# Patient Record
Sex: Female | Born: 1945 | Race: White | Hispanic: No | Marital: Married | Smoking: Never smoker
Health system: Southern US, Community
[De-identification: ages and names within clinical notes are randomized; demographics above are authoritative.]

## PROBLEM LIST (undated history)

## (undated) DIAGNOSIS — K219 Gastro-esophageal reflux disease without esophagitis: Secondary | ICD-10-CM

## (undated) DIAGNOSIS — R51 Headache: Secondary | ICD-10-CM

## (undated) DIAGNOSIS — IMO0002 Reserved for concepts with insufficient information to code with codable children: Secondary | ICD-10-CM

## (undated) DIAGNOSIS — K759 Inflammatory liver disease, unspecified: Secondary | ICD-10-CM

## (undated) DIAGNOSIS — Z8719 Personal history of other diseases of the digestive system: Secondary | ICD-10-CM

## (undated) DIAGNOSIS — M1990 Unspecified osteoarthritis, unspecified site: Secondary | ICD-10-CM

## (undated) DIAGNOSIS — R002 Palpitations: Secondary | ICD-10-CM

## (undated) HISTORY — PX: BREAST SURGERY: SHX581

## (undated) HISTORY — PX: TUBAL LIGATION: SHX77

## (undated) HISTORY — PX: APPENDECTOMY: SHX54

---

## 1991-12-14 HISTORY — PX: BACK SURGERY: SHX140

## 2013-04-26 ENCOUNTER — Other Ambulatory Visit (HOSPITAL_COMMUNITY): Payer: Self-pay | Admitting: Orthopaedic Surgery

## 2013-05-02 ENCOUNTER — Encounter (HOSPITAL_COMMUNITY): Payer: Self-pay | Admitting: Pharmacy Technician

## 2013-05-10 ENCOUNTER — Encounter (HOSPITAL_COMMUNITY)
Admission: RE | Admit: 2013-05-10 | Discharge: 2013-05-10 | Disposition: A | Discharge status: Home / Self Care | Payer: Medicare Other | Source: Ambulatory Visit | Attending: Orthopaedic Surgery | Admitting: Orthopaedic Surgery

## 2013-05-10 ENCOUNTER — Encounter (HOSPITAL_COMMUNITY): Payer: Self-pay

## 2013-05-10 DIAGNOSIS — M169 Osteoarthritis of hip, unspecified: Secondary | ICD-10-CM | POA: Insufficient documentation

## 2013-05-10 DIAGNOSIS — Z0183 Encounter for blood typing: Secondary | ICD-10-CM | POA: Insufficient documentation

## 2013-05-10 DIAGNOSIS — Z01812 Encounter for preprocedural laboratory examination: Secondary | ICD-10-CM | POA: Insufficient documentation

## 2013-05-10 DIAGNOSIS — M1610 Unilateral primary osteoarthritis, unspecified hip: Secondary | ICD-10-CM | POA: Insufficient documentation

## 2013-05-10 DIAGNOSIS — Z01810 Encounter for preprocedural cardiovascular examination: Secondary | ICD-10-CM | POA: Insufficient documentation

## 2013-05-10 HISTORY — DX: Palpitations: R00.2

## 2013-05-10 HISTORY — DX: Gastro-esophageal reflux disease without esophagitis: K21.9

## 2013-05-10 HISTORY — DX: Unspecified osteoarthritis, unspecified site: M19.90

## 2013-05-10 HISTORY — DX: Personal history of other diseases of the digestive system: Z87.19

## 2013-05-10 HISTORY — DX: Headache: R51

## 2013-05-10 HISTORY — DX: Inflammatory liver disease, unspecified: K75.9

## 2013-05-10 LAB — URINALYSIS, ROUTINE W REFLEX MICROSCOPIC
Bilirubin Urine: NEGATIVE
Nitrite: NEGATIVE
Specific Gravity, Urine: 1.028 (ref 1.005–1.030)
Urobilinogen, UA: 0.2 mg/dL (ref 0.0–1.0)
pH: 6 (ref 5.0–8.0)

## 2013-05-10 LAB — APTT: aPTT: 39 seconds — ABNORMAL HIGH (ref 24–37)

## 2013-05-10 LAB — BASIC METABOLIC PANEL
Calcium: 9.6 mg/dL (ref 8.4–10.5)
Creatinine, Ser: 0.78 mg/dL (ref 0.50–1.10)
GFR calc Af Amer: >90 mL/min (ref >90)
GFR calc non Af Amer: 85 mL/min — ABNORMAL LOW (ref >90)
Sodium: 139 mEq/L (ref 135–145)

## 2013-05-10 LAB — CBC
MCV: 91.2 fL (ref 78.0–100.0)
Platelets: 220 10*3/uL (ref 150–400)
RBC: 4.52 MIL/uL (ref 3.87–5.11)
RDW: 13 % (ref 11.5–15.5)
WBC: 4.2 10*3/uL (ref 4.0–10.5)

## 2013-05-10 LAB — SURGICAL PCR SCREEN: MRSA, PCR: NEGATIVE

## 2013-05-10 LAB — PROTIME-INR: INR: 0.99 (ref 0.00–1.49)

## 2013-05-10 LAB — URINE MICROSCOPIC-ADD ON

## 2013-05-10 NOTE — Patient Instructions (Signed)
YOUR SURGERY IS SCHEDULED AT [facility]  ON:  Friday  June 6th  REPORT TO [facility] AT:  5:30 AM      PHONE # FOR SHORT STAY IS [phone]  DO NOT EAT OR DRINK ANYTHING AFTER MIDNIGHT THE NIGHT BEFORE YOUR SURGERY.  YOU MAY BRUSH YOUR TEETH, RINSE OUT YOUR MOUTH--BUT NO WATER, NO FOOD, NO CHEWING GUM, NO MINTS, NO CANDIES, NO CHEWING TOBACCO.  PLEASE TAKE THE FOLLOWING MEDICATIONS THE AM OF YOUR SURGERY WITH A FEW SIPS OF WATER: PANTOPRAZOL    DO NOT BRING VALUABLES, MONEY, CREDIT CARDS.  DO NOT WEAR JEWELRY, MAKE-UP, NAIL POLISH AND NO METAL PINS OR CLIPS IN YOUR HAIR. CONTACT LENS, DENTURES / PARTIALS, GLASSES SHOULD NOT BE WORN TO SURGERY AND IN MOST CASES-HEARING AIDS WILL NEED TO BE REMOVED.  BRING YOUR GLASSES CASE, ANY EQUIPMENT NEEDED FOR YOUR CONTACT LENS. FOR PATIENTS ADMITTED TO THE HOSPITAL--CHECK OUT TIME THE DAY OF DISCHARGE IS 11:00 AM.  ALL INPATIENT ROOMS ARE PRIVATE - WITH BATHROOM, TELEPHONE, TELEVISION AND WIFI INTERNET.                             PLEASE READ OVER ANY  FACT SHEETS THAT YOU WERE GIVEN: MRSA INFORMATION, BLOOD TRANSFUSION INFORMATION, INCENTIVE SPIROMETER INFORMATION. FAILURE TO FOLLOW THESE INSTRUCTIONS MAY RESULT IN THE CANCELLATION OF YOUR SURGERY.   PATIENT SIGNATURE_________________________________

## 2013-05-10 NOTE — Pre-Procedure Instructions (Signed)
HX OF PALPITATIONS - PREOP EKG WAS DONE TODAY AT [facility] - EKG NORMAL - CXR NOT NEEDED PER ANESTHESIOLOGIST'S GUIDELINES.

## 2013-05-18 ENCOUNTER — Inpatient Hospital Stay (HOSPITAL_COMMUNITY): Payer: Medicare Other

## 2013-05-18 ENCOUNTER — Inpatient Hospital Stay (HOSPITAL_COMMUNITY)
Admission: RE | Admit: 2013-05-18 | Discharge: 2013-05-20 | DRG: 470 | Disposition: A | Discharge status: Home Health | Payer: Medicare Other | Source: Ambulatory Visit | Attending: Orthopaedic Surgery | Admitting: Orthopaedic Surgery

## 2013-05-18 ENCOUNTER — Encounter (HOSPITAL_COMMUNITY)
Admission: RE | Disposition: A | Discharge status: Home Health | Payer: Self-pay | Source: Ambulatory Visit | Attending: Orthopaedic Surgery

## 2013-05-18 ENCOUNTER — Inpatient Hospital Stay (HOSPITAL_COMMUNITY): Payer: Medicare Other | Admitting: Certified Registered"

## 2013-05-18 ENCOUNTER — Encounter (HOSPITAL_COMMUNITY): Payer: Self-pay | Admitting: Certified Registered"

## 2013-05-18 ENCOUNTER — Encounter (HOSPITAL_COMMUNITY): Payer: Self-pay | Admitting: *Deleted

## 2013-05-18 DIAGNOSIS — Z8619 Personal history of other infectious and parasitic diseases: Secondary | ICD-10-CM

## 2013-05-18 DIAGNOSIS — M169 Osteoarthritis of hip, unspecified: Principal | ICD-10-CM | POA: Diagnosis present

## 2013-05-18 DIAGNOSIS — Z79899 Other long term (current) drug therapy: Secondary | ICD-10-CM

## 2013-05-18 DIAGNOSIS — D62 Acute posthemorrhagic anemia: Secondary | ICD-10-CM | POA: Diagnosis not present

## 2013-05-18 DIAGNOSIS — M1610 Unilateral primary osteoarthritis, unspecified hip: Principal | ICD-10-CM | POA: Diagnosis present

## 2013-05-18 DIAGNOSIS — K219 Gastro-esophageal reflux disease without esophagitis: Secondary | ICD-10-CM | POA: Diagnosis present

## 2013-05-18 HISTORY — PX: TOTAL HIP ARTHROPLASTY: SHX124

## 2013-05-18 LAB — CBC
MCH: 30.5 pg (ref 26.0–34.0)
MCHC: 33.4 g/dL (ref 30.0–36.0)
Platelets: 175 10*3/uL (ref 150–400)

## 2013-05-18 LAB — TYPE AND SCREEN: Antibody Screen: NEGATIVE

## 2013-05-18 SURGERY — ARTHROPLASTY, HIP, TOTAL, ANTERIOR APPROACH
Anesthesia: Spinal | Site: Hip | Laterality: Left | Wound class: Clean

## 2013-05-18 MED ORDER — PANTOPRAZOLE SODIUM 40 MG PO TBEC
40.0000 mg | DELAYED_RELEASE_TABLET | Freq: Every day | ORAL | Status: DC
  Administered 2013-05-19 – 2013-05-20 (×2): 40 mg via ORAL
  Filled 2013-05-18 (×3): qty 1

## 2013-05-18 MED ORDER — DIPHENHYDRAMINE HCL 12.5 MG/5ML PO ELIX: 12.5000 mg | ORAL_SOLUTION | ORAL | Status: DC | PRN

## 2013-05-18 MED ORDER — PROPOFOL INFUSION 10 MG/ML OPTIME
INTRAVENOUS | Status: DC | PRN
  Administered 2013-05-18: 100 ug/kg/min via INTRAVENOUS

## 2013-05-18 MED ORDER — OXYCODONE HCL ER 10 MG PO T12A
10.0000 mg | EXTENDED_RELEASE_TABLET | Freq: Two times a day (BID) | ORAL | Status: DC
  Administered 2013-05-19: 10 mg via ORAL
  Filled 2013-05-18 (×2): qty 1

## 2013-05-18 MED ORDER — DEXAMETHASONE SODIUM PHOSPHATE 10 MG/ML IJ SOLN
INTRAMUSCULAR | Status: DC | PRN
  Administered 2013-05-18: 10 mg via INTRAVENOUS

## 2013-05-18 MED ORDER — SODIUM CHLORIDE 0.9 % IV SOLN
INTRAVENOUS | Status: DC
  Administered 2013-05-18: 1000 mL via INTRAVENOUS
  Administered 2013-05-19: 02:00:00 via INTRAVENOUS

## 2013-05-18 MED ORDER — ACETAMINOPHEN 650 MG RE SUPP: 650.0000 mg | Freq: Four times a day (QID) | RECTAL | Status: DC | PRN

## 2013-05-18 MED ORDER — ONDANSETRON HCL 4 MG/2ML IJ SOLN: 4.0000 mg | Freq: Four times a day (QID) | INTRAMUSCULAR | Status: DC | PRN

## 2013-05-18 MED ORDER — DOCUSATE SODIUM 100 MG PO CAPS
100.0000 mg | ORAL_CAPSULE | Freq: Two times a day (BID) | ORAL | Status: DC
  Administered 2013-05-18 – 2013-05-20 (×4): 100 mg via ORAL
  Filled 2013-05-18 (×3): qty 1

## 2013-05-18 MED ORDER — 0.9 % SODIUM CHLORIDE (POUR BTL) OPTIME
TOPICAL | Status: DC | PRN
  Administered 2013-05-18: 1000 mL

## 2013-05-18 MED ORDER — CLINDAMYCIN PHOSPHATE 600 MG/50ML IV SOLN
600.0000 mg | Freq: Four times a day (QID) | INTRAVENOUS | Status: AC
  Administered 2013-05-18 (×2): 600 mg via INTRAVENOUS
  Filled 2013-05-18 (×2): qty 50

## 2013-05-18 MED ORDER — BISACODYL 10 MG RE SUPP: 10.0000 mg | Freq: Every day | RECTAL | Status: DC | PRN

## 2013-05-18 MED ORDER — SODIUM CHLORIDE 0.9 % IV BOLUS (SEPSIS)
500.0000 mL | Freq: Once | INTRAVENOUS | Status: AC
  Administered 2013-05-18: 500 mL via INTRAVENOUS

## 2013-05-18 MED ORDER — HYDROMORPHONE HCL PF 1 MG/ML IJ SOLN
1.0000 mg | INTRAMUSCULAR | Status: DC | PRN
  Administered 2013-05-18: 0.5 mg via INTRAVENOUS
  Filled 2013-05-18: qty 1

## 2013-05-18 MED ORDER — PROMETHAZINE HCL 25 MG/ML IJ SOLN: 6.2500 mg | INTRAMUSCULAR | Status: DC | PRN

## 2013-05-18 MED ORDER — NALOXONE HCL 0.4 MG/ML IJ SOLN
INTRAMUSCULAR | Status: AC
  Administered 2013-05-18: 0.4 mg
  Filled 2013-05-18: qty 1

## 2013-05-18 MED ORDER — FENTANYL CITRATE 0.05 MG/ML IJ SOLN
INTRAMUSCULAR | Status: DC | PRN
  Administered 2013-05-18: 50 ug via INTRAVENOUS

## 2013-05-18 MED ORDER — MIDAZOLAM HCL 5 MG/5ML IJ SOLN
INTRAMUSCULAR | Status: DC | PRN
  Administered 2013-05-18: 2 mg via INTRAVENOUS

## 2013-05-18 MED ORDER — NALOXONE HCL 0.4 MG/ML IJ SOLN
0.4000 mg | INTRAMUSCULAR | Status: DC | PRN
  Administered 2013-05-18: 0.4 mg via INTRAVENOUS

## 2013-05-18 MED ORDER — FENTANYL CITRATE 0.05 MG/ML IJ SOLN: 25.0000 ug | INTRAMUSCULAR | Status: DC | PRN

## 2013-05-18 MED ORDER — SODIUM CHLORIDE 0.9 % IR SOLN
Status: DC | PRN
  Administered 2013-05-18: 1000 mL

## 2013-05-18 MED ORDER — LACTATED RINGERS IV SOLN
INTRAVENOUS | Status: DC | PRN
  Administered 2013-05-18 (×2): via INTRAVENOUS

## 2013-05-18 MED ORDER — ONDANSETRON HCL 4 MG/2ML IJ SOLN
INTRAMUSCULAR | Status: DC | PRN
  Administered 2013-05-18: 4 mg via INTRAVENOUS

## 2013-05-18 MED ORDER — BUPIVACAINE HCL (PF) 0.5 % IJ SOLN
INTRAMUSCULAR | Status: DC | PRN
  Administered 2013-05-18: 15 mg

## 2013-05-18 MED ORDER — ASPIRIN EC 325 MG PO TBEC
325.0000 mg | DELAYED_RELEASE_TABLET | Freq: Two times a day (BID) | ORAL | Status: DC
  Administered 2013-05-18 – 2013-05-20 (×3): 325 mg via ORAL
  Filled 2013-05-18 (×6): qty 1

## 2013-05-18 MED ORDER — ALUM & MAG HYDROXIDE-SIMETH 200-200-20 MG/5ML PO SUSP: 30.0000 mL | ORAL | Status: DC | PRN

## 2013-05-18 MED ORDER — CEFAZOLIN SODIUM-DEXTROSE 2-3 GM-% IV SOLR
2.0000 g | INTRAVENOUS | Status: AC
  Administered 2013-05-18: 2 g via INTRAVENOUS

## 2013-05-18 MED ORDER — POLYETHYLENE GLYCOL 3350 17 G PO PACK
17.0000 g | PACK | Freq: Every day | ORAL | Status: DC | PRN
  Filled 2013-05-18 (×2): qty 1

## 2013-05-18 MED ORDER — PHENYLEPHRINE HCL 10 MG/ML IJ SOLN
INTRAMUSCULAR | Status: DC | PRN
  Administered 2013-05-18 (×3): 40 ug via INTRAVENOUS

## 2013-05-18 MED ORDER — ONDANSETRON HCL 4 MG PO TABS: 4.0000 mg | ORAL_TABLET | Freq: Four times a day (QID) | ORAL | Status: DC | PRN

## 2013-05-18 MED ORDER — PHENYLEPHRINE HCL 10 MG/ML IJ SOLN
10.0000 mg | INTRAVENOUS | Status: DC | PRN
  Administered 2013-05-18: 50 ug/min via INTRAVENOUS

## 2013-05-18 MED ORDER — METHOCARBAMOL 500 MG PO TABS: 500.0000 mg | ORAL_TABLET | Freq: Four times a day (QID) | ORAL | Status: DC | PRN

## 2013-05-18 MED ORDER — OXYCODONE HCL 5 MG PO TABS: 5.0000 mg | ORAL_TABLET | ORAL | Status: DC | PRN

## 2013-05-18 MED ORDER — ACETAMINOPHEN 325 MG PO TABS
650.0000 mg | ORAL_TABLET | Freq: Four times a day (QID) | ORAL | Status: DC | PRN
  Administered 2013-05-18 – 2013-05-20 (×5): 650 mg via ORAL
  Filled 2013-05-18 (×6): qty 2

## 2013-05-18 MED ORDER — METHOCARBAMOL 100 MG/ML IJ SOLN
500.0000 mg | Freq: Four times a day (QID) | INTRAVENOUS | Status: DC | PRN
  Administered 2013-05-18: 500 mg via INTRAVENOUS
  Filled 2013-05-18: qty 5

## 2013-05-18 MED ORDER — MENTHOL 3 MG MT LOZG: 1.0000 | LOZENGE | OROMUCOSAL | Status: DC | PRN

## 2013-05-18 MED ORDER — METOCLOPRAMIDE HCL 10 MG PO TABS: 5.0000 mg | ORAL_TABLET | Freq: Three times a day (TID) | ORAL | Status: DC | PRN

## 2013-05-18 MED ORDER — PHENOL 1.4 % MT LIQD: 1.0000 | OROMUCOSAL | Status: DC | PRN

## 2013-05-18 MED ORDER — METOCLOPRAMIDE HCL 5 MG/ML IJ SOLN: 5.0000 mg | Freq: Three times a day (TID) | INTRAMUSCULAR | Status: DC | PRN

## 2013-05-18 MED ORDER — FERROUS SULFATE 325 (65 FE) MG PO TABS
325.0000 mg | ORAL_TABLET | Freq: Three times a day (TID) | ORAL | Status: DC
  Administered 2013-05-18 – 2013-05-20 (×4): 325 mg via ORAL
  Filled 2013-05-18 (×9): qty 1

## 2013-05-18 MED ORDER — ZOLPIDEM TARTRATE 5 MG PO TABS: 5.0000 mg | ORAL_TABLET | Freq: Every evening | ORAL | Status: DC | PRN

## 2013-05-18 SURGICAL SUPPLY — 41 items
BAG ZIPLOCK 12X15 (MISCELLANEOUS) ×4 IMPLANT
BLADE SAW SGTL 18X1.27X75 (BLADE) ×2 IMPLANT
CAPT HIP PF COP ×2 IMPLANT
CELLS DAT CNTRL 66122 CELL SVR (MISCELLANEOUS) ×1 IMPLANT
CLOTH BEACON ORANGE TIMEOUT ST (SAFETY) ×2 IMPLANT
DERMABOND ADVANCED (GAUZE/BANDAGES/DRESSINGS) ×1
DERMABOND ADVANCED .7 DNX12 (GAUZE/BANDAGES/DRESSINGS) ×1 IMPLANT
DRAPE C-ARM 42X120 X-RAY (DRAPES) ×2 IMPLANT
DRAPE STERI IOBAN 125X83 (DRAPES) ×2 IMPLANT
DRAPE U-SHAPE 47X51 STRL (DRAPES) ×6 IMPLANT
DRSG AQUACEL AG ADV 3.5X10 (GAUZE/BANDAGES/DRESSINGS) ×2 IMPLANT
DRSG TEGADERM 4X4.75 (GAUZE/BANDAGES/DRESSINGS) ×2 IMPLANT
DURAPREP 26ML APPLICATOR (WOUND CARE) ×2 IMPLANT
ELECT BLADE TIP CTD 4 INCH (ELECTRODE) ×2 IMPLANT
ELECT REM PT RETURN 9FT ADLT (ELECTROSURGICAL) ×2
ELECTRODE REM PT RTRN 9FT ADLT (ELECTROSURGICAL) ×1 IMPLANT
EVACUATOR 1/8 PVC DRAIN (DRAIN) ×4 IMPLANT
FACESHIELD LNG OPTICON STERILE (SAFETY) ×8 IMPLANT
GAUZE SPONGE 2X2 8PLY STRL LF (GAUZE/BANDAGES/DRESSINGS) ×1 IMPLANT
GLOVE BIO SURGEON STRL SZ7.5 (GLOVE) ×2 IMPLANT
GLOVE BIOGEL PI IND STRL 8 (GLOVE) ×2 IMPLANT
GLOVE BIOGEL PI INDICATOR 8 (GLOVE) ×2
GLOVE ECLIPSE 8.0 STRL XLNG CF (GLOVE) ×2 IMPLANT
GOWN STRL REIN XL XLG (GOWN DISPOSABLE) ×4 IMPLANT
HANDPIECE INTERPULSE COAX TIP (DISPOSABLE) ×1
KIT BASIN OR (CUSTOM PROCEDURE TRAY) ×2 IMPLANT
PACK TOTAL JOINT (CUSTOM PROCEDURE TRAY) ×2 IMPLANT
PADDING CAST COTTON 6X4 STRL (CAST SUPPLIES) ×2 IMPLANT
RTRCTR WOUND ALEXIS 18CM MED (MISCELLANEOUS) ×2
SET HNDPC FAN SPRY TIP SCT (DISPOSABLE) ×1 IMPLANT
SPONGE GAUZE 2X2 STER 10/PKG (GAUZE/BANDAGES/DRESSINGS) ×1
SUT ETHIBOND NAB CT1 #1 30IN (SUTURE) ×4 IMPLANT
SUT ETHILON 3 0 PS 1 (SUTURE) ×2 IMPLANT
SUT MNCRL AB 4-0 PS2 18 (SUTURE) ×2 IMPLANT
SUT VIC AB 0 CT1 36 (SUTURE) ×2 IMPLANT
SUT VIC AB 1 CT1 36 (SUTURE) ×4 IMPLANT
SUT VIC AB 2-0 CT1 27 (SUTURE) ×1
SUT VIC AB 2-0 CT1 TAPERPNT 27 (SUTURE) ×1 IMPLANT
TOWEL OR 17X26 10 PK STRL BLUE (TOWEL DISPOSABLE) ×2 IMPLANT
TOWEL OR NON WOVEN STRL DISP B (DISPOSABLE) ×2 IMPLANT
TRAY FOLEY CATH 14FRSI W/METER (CATHETERS) ×2 IMPLANT

## 2013-05-18 NOTE — Anesthesia Postprocedure Evaluation (Signed)
  Anesthesia Post-op Note  Patient: Victoria Ward  Procedure(s) Performed: Procedure(s) (LRB): LEFT TOTAL HIP ARTHROPLASTY ANTERIOR APPROACH (Left)  Patient Location: PACU  Anesthesia Type: Spinal  Level of Consciousness: awake and alert   Airway and Oxygen Therapy: Patient Spontanous Breathing  Post-op Pain: mild  Post-op Assessment: Post-op Vital signs reviewed, Patient's Cardiovascular Status Stable, Respiratory Function Stable, Patent Airway and No signs of Nausea or vomiting  Last Vitals:  Filed Vitals:   05/18/13 0945  BP: 112/69  Pulse: 68  Temp:   Resp: 14    Post-op Vital Signs: stable   Complications: No apparent anesthesia complications

## 2013-05-18 NOTE — Anesthesia Procedure Notes (Signed)
Spinal  Patient location during procedure: OR Staffing Performed by: anesthesiologist  Preanesthetic Checklist Completed: patient identified, site marked, surgical consent, pre-op evaluation, timeout performed, IV checked, risks and benefits discussed and monitors and equipment checked Spinal Block Patient position: sitting Prep: Betadine Patient monitoring: heart rate, continuous pulse ox and blood pressure Injection technique: single-shot Needle Needle type: Quincke  Needle gauge: 24 G Needle length: 9 cm Additional Notes Expiration date of kit checked and confirmed. Patient tolerated procedure well, without complications.

## 2013-05-18 NOTE — Progress Notes (Signed)
Utilization review completed.  

## 2013-05-18 NOTE — Progress Notes (Signed)
After narcan given, B/P 116.87.

## 2013-05-18 NOTE — Evaluation (Signed)
Physical Therapy Evaluation Patient Details Name: Victoria Ward MRN: [id number] DOB: December 02, 1946 Today's Date: 05/18/2013 Time: 4098-1191 PT Time Calculation (min): 25 min  PT Assessment / Plan / Recommendation Clinical Impression  Pt is a 67 year old female s/p L direct anterior THR and agreeable to attempt mobility POD 0.  Pt would benefit from acute PT services in order to improve independence with transfers, ambulation, and stairs to prepare for d/c home with spouse.  Pt with limited eval due to syncope however pt safely assisted back to supine and awakened once in supine position.  Pt reported happy to be able to get up.    PT Assessment  Patient needs continued PT services    Follow Up Recommendations  Home health PT    Does the patient have the potential to tolerate intense rehabilitation      Barriers to Discharge        Equipment Recommendations  Rolling walker with 5" wheels    Recommendations for Other Services     Frequency 7X/week    Precautions / Restrictions Precautions Precautions: Fall;None Precaution Comments: no hip precautions Restrictions Other Position/Activity Restrictions: WBAT   Pertinent Vitals/Pain RN okay with mobility if checking BPs BP supine: 112/68 mmHg Sitting: 102/65 mmHg Upon supine after syncope: 87/47 mmHg Pt placed in trendelenberg and performed ankle pumps and BP 100/59 mmHg RN notified of BP and syncope.      Mobility  Bed Mobility Bed Mobility: Supine to Sit;Sit to Supine Supine to Sit: 2: Max assist Sit to Supine: 1: +2 Total assist Sit to Supine: Patient Percentage: 0% Details for Bed Mobility Assistance: assist for L LE and trunk with verbal cues for technique to sit upright, pt total to supine due to syncope  Transfers Transfers: Sit to Stand;Stand to Sit Sit to Stand: 4: Min guard;With upper extremity assist;From bed Stand to Sit: To bed;1: +2 Total assist Stand to Sit: Patient Percentage: 0% Details for Transfer  Assistance: verbal cues for safe technique, again total to sit on bed due to syncope Ambulation/Gait Ambulation/Gait Assistance: 1: +2 Total assist Ambulation/Gait: Patient Percentage: 70% Ambulation Distance (Feet): 4 Feet Assistive device: Rolling walker Ambulation/Gait Assistance Details: pt instructed in sequence and technique, took a couple steps and pt not as talkative and unable to describe lightheadedness however appeared woozy so assisted with stepping backward toward bed and pt with syncope episode so +2 total assist to sit down on bed and return to supine trendelenberg position with pt awakening upon supine Gait Pattern: Step-to pattern;Decreased step length - left;Trunk flexed    Exercises     PT Diagnosis: Difficulty walking  PT Problem List: Decreased strength;Decreased activity tolerance;Decreased mobility;Decreased knowledge of use of DME;Cardiopulmonary status limiting activity PT Treatment Interventions: DME instruction;Gait training;Stair training;Functional mobility training;Patient/family education;Therapeutic activities;Therapeutic exercise   PT Goals Acute Rehab PT Goals PT Goal Formulation: With patient Time For Goal Achievement: 05/25/13 Potential to Achieve Goals: Good Pt will go Supine/Side to Sit: with modified independence PT Goal: Supine/Side to Sit - Progress: Goal set today Pt will go Sit to Stand: with modified independence PT Goal: Sit to Stand - Progress: Goal set today Pt will go Stand to Sit: with modified independence PT Goal: Stand to Sit - Progress: Goal set today Pt will Ambulate: 51 - 150 feet;with modified independence;with least restrictive assistive device PT Goal: Ambulate - Progress: Goal set today Pt will Go Up / Down Stairs: 3-5 stairs;with least restrictive assistive device;with supervision;with rail(s) PT Goal: Up/Down Stairs -  Progress: Goal set today Pt will Perform Home Exercise Program: with supervision, verbal cues  required/provided PT Goal: Perform Home Exercise Program - Progress: Goal set today  Visit Information  Last PT Received On: 05/18/13 Assistance Needed: +2    Subjective Data  Subjective: I'm glad I got to get up. Patient Stated Goal: home   Prior Functioning  Home Living Lives With: Spouse Type of Home: House Home Access: Stairs to enter Secretary/administrator of Steps: 4 Entrance Stairs-Rails: Right;Left Home Layout: One level Home Adaptive Equipment: None Prior Function Level of Independence: Independent Communication Communication: No difficulties    Cognition  Cognition Arousal/Alertness: Suspect due to medications (occasionally lethargic however alert with cues) Behavior During Therapy: WFL for tasks assessed/performed Overall Cognitive Status: Within Functional Limits for tasks assessed    Extremity/Trunk Assessment Right Lower Extremity Assessment RLE ROM/Strength/Tone: WFL for tasks assessed RLE Sensation: WFL - Light Touch Left Lower Extremity Assessment LLE ROM/Strength/Tone: Deficits LLE ROM/Strength/Tone Deficits: assist required for mobility, decreased active range against gravity observed LLE Sensation: WFL - Light Touch   Balance    End of Session PT - End of Session Activity Tolerance: Other (comment) (limited by syncope) Patient left: in bed;with call bell/phone within reach;with family/visitor present Nurse Communication: Other (comment) (RN notified of syncope and BPs)  GP     Nilani Hugill,KATHrine E 05/18/2013, 4:09 PM Zenovia Jarred, PT, DPT 05/18/2013 Pager: [phone]

## 2013-05-18 NOTE — Transfer of Care (Signed)
Immediate Anesthesia Transfer of Care Note  Patient: Victoria Ward  Procedure(s) Performed: Procedure(s): LEFT TOTAL HIP ARTHROPLASTY ANTERIOR APPROACH (Left)  Patient Location: PACU  Anesthesia Type:MAC and Spinal  Level of Consciousness: awake and alert   Airway & Oxygen Therapy: Patient Spontanous Breathing and Patient connected to face mask oxygen  Post-op Assessment: Report given to PACU RN and Post -op Vital signs reviewed and stable  Post vital signs: Reviewed and stable  Complications: No apparent anesthesia complications

## 2013-05-18 NOTE — Progress Notes (Signed)
Patient given .5 dilaudid IV push and became  hypotensive.  Dr. Magnus Ivan notified.  Orders received for narcain, 500cc bolus NS, and CBC.

## 2013-05-18 NOTE — Anesthesia Preprocedure Evaluation (Signed)
Anesthesia Evaluation  Patient identified by MRN, date of birth, ID band Patient awake    Reviewed: Allergy & Precautions, H&P , NPO status , Patient's Chart, lab work & pertinent test results  Airway Mallampati: II TM Distance: >3 FB Neck ROM: Full    Dental no notable dental hx.    Pulmonary neg pulmonary ROS,  breath sounds clear to auscultation  Pulmonary exam normal       Cardiovascular negative cardio ROS  Rhythm:Regular Rate:Normal     Neuro/Psych negative neurological ROS  negative psych ROS   GI/Hepatic Neg liver ROS, GERD-  Medicated,  Endo/Other  negative endocrine ROS  Renal/GU negative Renal ROS  negative genitourinary   Musculoskeletal negative musculoskeletal ROS (+)   Abdominal   Peds negative pediatric ROS (+)  Hematology negative hematology ROS (+)   Anesthesia Other Findings   Reproductive/Obstetrics negative OB ROS                           Anesthesia Physical Anesthesia Plan  ASA: I  Anesthesia Plan: Spinal   Post-op Pain Management:    Induction: Intravenous  Airway Management Planned: Simple Face Mask  Additional Equipment:   Intra-op Plan:   Post-operative Plan:   Informed Consent: I have reviewed the patients History and Physical, chart, labs and discussed the procedure including the risks, benefits and alternatives for the proposed anesthesia with the patient or authorized representative who has indicated his/her understanding and acceptance.     Plan Discussed with: CRNA and Surgeon  Anesthesia Plan Comments:         Anesthesia Quick Evaluation

## 2013-05-18 NOTE — Op Note (Signed)
NAMELASHUNA, Victoria Ward NO.:  [id number]  MEDICAL RECORD NO.:  [id number]  LOCATION:  1620                         FACILITY:  [facility]  PHYSICIAN:  Vanita Panda. 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  PROCEDURE:  Left total hip arthroplasty under direct anterior approach.  IMPLANTS:  DePuy Sector Gription acetabular component size 50, with apex hole eliminator guide and 1 screw, size 32+ 4 neutral polyethylene liner, Corail femoral component with standard offset, size 11, size 32+ 1 ceramic hip ball.  SURGEON:  Vanita Panda. Magnus Ivan, M.D.  ASSISTANT:  Richardean Canal, PA-C.  ANESTHESIA:  Spinal.  ANTIBIOTICS:  2 g IV Ancef.  ESTIMATED BLOOD LOSS:  750 mL.  COMPLICATIONS:  None.  INDICATIONS:  Ms. Oblinger is a very pleasant 67 year old female with severe end-stage arthritis involving her left hip.  With failure of conservative treatment, she wished to proceed with a total hip arthroplasty.  She has had daily pain, decreased mobility, and decreased quality of life.  She understands the risks of acute blood loss anemia, nerve or vessel injury, fracture, infection, DVT, and the goals are to decrease pain and improved mobility and improved quality of life.  PROCEDURE DESCRIPTION:  After informed consent was obtained, appropriate left hip was marked.  She was brought to the operating room and spinal anesthesia was obtained while she was on her stretcher.  She was laid in the supine position on the stretcher.  A Foley catheter was placed and in both feet had traction boots applied to them.  She was next placed supine on the Hana fracture table with the  perineal post in place and both legs in inline with skeletal traction devices, but no traction applied.  Her left hip was then prepped and draped with DuraPrep and sterile drapes.  Time-out was called to identify correct patient, correct left hip.  We then made an incision just inferior and posterior to the anterior-superior iliac spine and carried this obliquely down the leg.  I dissected down to the tensor fascia lata and the tensor fascia was divided longitudinally.  I then proceeded with a direct anterior approach to the hip.  A Cobra retractor was placed around the lateral neck and up underneath the rectus femoris.  A Cobra retractor was placed medially.  I opened up the hip capsule and placed the Cobra retractors within the hip capsule.  I then cauterized the lateral femoral circumflex vessels.  I made my femoral neck cut with an oscillating saw just proximal to the lesser trochanter and completed this with an osteotome.  We placed a corkscrew guide in the femoral head and removed the femoral head in its entirety.  We then cleaned the acetabulum debris including remnants of the labrum and began reaming from size 42 reamer in 2 mm increments up to size 50 with all remnants placed under direct visualization.  The last reamer also placed under  direct fluoroscopy, so we could verify our depth of reaming, our inclination, and anteversion. Once I was pleased with this position, we placed the real DePuy Sector Gription acetabular component size 50, an apex hole eliminator guide and a single screw.  I then placed the real 32+ 4 neutral polyethylene liner.  Attention was then turned to the femur with the leg externally rotated to 90-95 degrees extended and adducted.  We placed a Mueller retractor medially and a Hohmann retractor behind the greater trochanter.  I released the lateral joint capsule and then used a rongeur to lateralize and a box cutting guide to open up the femoral canal.  I  then began broaching using the Corail femoral broaching system from a size 8 up to a size 11 with a size 11 in place.  I used a calcar planer and then placed a standard neck and a 32+ 1 trial hip ball.  We brought the leg back up and over with traction and internal rotation, reduced in the pelvis and it was stable with internal and external rotation with minimal shuck and her leg lengths were measured equal under direct fluoroscopy.  We then dislocated the hip and removed the trial components.  We placed the real Corail size 11 femoral component with standard offset and the real 32+ 1 ceramic hip ball.  We reduced this back in the acetabulum and again it was stable.  We copiously irrigated the soft tissues with normal saline solution using pulsatile lavage.  I did close the arthrotomy with interrupted #1 Ethibond suture.  I placed a medium Hemovac in the arthrotomy and closed the tensor fascia with running #1 Vicryl suture, followed by 0 Vicryl in the deep tissue, 2-0 Vicryl in the subcutaneous tissue, 4-0 Monocryl subcuticular stitch, and Dermabond on the skin, and then a Aquacel dressing.  She was taken off the Hana table into the recovery room in stable condition.  All final counts were correct.  There were no complications noted.  Of note, Richardean Canal PA-C was present during the entire case and his presence was crucial throughout all patient positioning, retracting, and closure of the wound.     Vanita Panda. Magnus Ivan, M.D.     CYB/MEDQ  D:  05/18/2013  T:  05/18/2013  Job:  [id number]

## 2013-05-18 NOTE — Brief Op Note (Signed)
05/18/2013  9:10 AM  PATIENT:  Maureen Ralphs  67 y.o. female  PRE-OPERATIVE DIAGNOSIS:  Severe arthritis left hip  POST-OPERATIVE DIAGNOSIS:  Severe arthritis left hip  PROCEDURE:  Procedure(s): LEFT TOTAL HIP ARTHROPLASTY ANTERIOR APPROACH (Left)  SURGEON:  Surgeon(s) and Role:    * Kathryne Hitch, MD - Primary  PHYSICIAN ASSISTANT: Rexene Edison, PA-C  ANESTHESIA:   spinal  EBL:  Total I/O In: 1000 [I.V.:1000] Out: 1350 [Urine:600; Blood:750]  BLOOD ADMINISTERED:none  DRAINS: (medium) Hemovact drain(s) in the hip with  Suction Open   LOCAL MEDICATIONS USED:  NONE  SPECIMEN:  No Specimen  DISPOSITION OF SPECIMEN:  N/A  COUNTS:  YES  TOURNIQUET:  * No tourniquets in log *  DICTATION: .Other Dictation: Dictation Number [id number]  PLAN OF CARE: Admit to inpatient   PATIENT DISPOSITION:  PACU - hemodynamically stable.   Delay start of Pharmacological VTE agent (>24hrs) due to surgical blood loss or risk of bleeding: no

## 2013-05-18 NOTE — H&P (Signed)
TOTAL HIP ADMISSION H&P  Patient is admitted for left total hip arthroplasty.  Subjective:  Chief Complaint: left hip pain  HPI: Victoria Ward, 67 y.o. female, has a history of pain and functional disability in the left hip(s) due to arthritis and patient has failed non-surgical conservative treatments for greater than 12 weeks to include NSAID's and/or analgesics, corticosteriod injections, flexibility and strengthening excercises and activity modification.  Onset of symptoms was gradual starting 2 years ago with gradually worsening course since that time.The patient noted no past surgery on the left hip(s).  Patient currently rates pain in the left hip at 9 out of 10 with activity. Patient has night pain, worsening of pain with activity and weight bearing, trendelenberg gait, pain that interfers with activities of daily living and pain with passive range of motion. Patient has evidence of subchondral cysts, subchondral sclerosis, periarticular osteophytes and joint space narrowing by imaging studies. This condition presents safety issues increasing the risk of falls.  There is no current active infection.  Patient Active Problem List   Diagnosis Date Noted  . Degenerative arthritis of hip 05/18/2013   Past Medical History  Diagnosis Date  . Heart palpitations     HX OF PALPITATIONS WITH STRESS  . GERD (gastroesophageal reflux disease)   . H/O hiatal hernia   . Headache(784.0)     MIGRAINES WITH AURA- JUST OCCAS  . Arthritis     PAIN AND OA BOTH HIPS - LEFT IS WORSE.  PT HAS ARTHRITIS IN HER BACK  . 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 Tubal ligation    . Back surgery  1993    LAMINECTOMY  . Breast surgery      BREAST BIOPSY X 2 - BENIGN  . Appendectomy      1998    Prescriptions prior to admission  Medication Sig Dispense Refill  . acetaminophen (TYLENOL) 500 MG  tablet Take 500 mg by mouth every 6 (six) hours as needed for pain.      . pantoprazole (PROTONIX) 40 MG tablet Take 40 mg by mouth daily before breakfast.       Allergies  Allergen Reactions  . Elavil (Amitriptyline)     hyperactivity  . Levaquin (Levofloxacin) Itching and Swelling  . Penicillins Rash    History  Substance Use Topics  . Smoking status: Never Smoker   . Smokeless tobacco: Never Used  . Alcohol Use: No    History reviewed. No pertinent family history.   Review of Systems  Musculoskeletal: Positive for joint pain.  All other systems reviewed and are negative.    Objective:  Physical Exam  Constitutional: She is oriented to person, place, and time. She appears well-developed and well-nourished.  HENT:  Head: Normocephalic and atraumatic.  Eyes: EOM are normal. Pupils are equal, round, and reactive to light.  Neck: Normal range of motion. Neck supple.  Cardiovascular: Normal rate and regular rhythm.   Respiratory: Effort normal and breath sounds normal.  GI: Soft. Bowel sounds are normal.  Musculoskeletal:       Left hip: She exhibits decreased range of motion, decreased strength, bony tenderness and crepitus.  Neurological: She is alert and oriented to person, place, and time.  Skin: Skin is warm and dry.  Psychiatric: She has a normal mood and affect.    Vital signs in last 24 hours: Temp:  [  98.2 F (36.8 C)] 98.2 F (36.8 C) (06/06 0516) Pulse Rate:  [101] 101 (06/06 0516) Resp:  [18] 18 (06/06 0516) BP: (165)/(77) 165/77 mmHg (06/06 0516) SpO2:  [99 %] 99 % (06/06 0516) Weight:  [66.679 kg (147 lb)] 66.679 kg (147 lb) (06/06 0540)  Labs:   Estimated body mass index is 24.46 kg/(m^2) as calculated from the following:   Height as of this encounter: 5\' 5"  (1.651 m).   Weight as of this encounter: 66.679 kg (147 lb).   Imaging Review Plain radiographs demonstrate severe degenerative joint disease of the left hip(s). The bone quality appears to  be good for age and reported activity level.  Assessment/Plan:  End stage arthritis, left hip(s)  The patient history, physical examination, clinical judgement of the provider and imaging studies are consistent with end stage degenerative joint disease of the left hip(s) and total hip arthroplasty is deemed medically necessary. The treatment options including medical management, injection therapy, arthroscopy and arthroplasty were discussed at length. The risks and benefits of total hip arthroplasty were presented and reviewed. The risks due to aseptic loosening, infection, stiffness, dislocation/subluxation,  thromboembolic complications and other imponderables were discussed.  The patient acknowledged the explanation, agreed to proceed with the plan and consent was signed. Patient is being admitted for inpatient treatment for surgery, pain control, PT, OT, prophylactic antibiotics, VTE prophylaxis, progressive ambulation and ADL's and discharge planning.The patient is planning to be discharged home with home health services

## 2013-05-19 LAB — BASIC METABOLIC PANEL
Chloride: 106 mEq/L (ref 96–112)
Creatinine, Ser: 0.71 mg/dL (ref 0.50–1.10)
GFR calc Af Amer: >90 mL/min (ref >90)
Potassium: 4.1 mEq/L (ref 3.5–5.1)

## 2013-05-19 LAB — CBC
MCV: 90.1 fL (ref 78.0–100.0)
Platelets: 158 10*3/uL (ref 150–400)
RDW: 13.2 % (ref 11.5–15.5)
WBC: 7.8 10*3/uL (ref 4.0–10.5)

## 2013-05-19 LAB — GLUCOSE, CAPILLARY: Glucose-Capillary: 128 mg/dL — ABNORMAL HIGH (ref 70–99)

## 2013-05-19 MED ORDER — ASPIRIN 325 MG PO TBEC: 325.0000 mg | DELAYED_RELEASE_TABLET | Freq: Two times a day (BID) | ORAL | Status: DC

## 2013-05-19 MED ORDER — METHOCARBAMOL 500 MG PO TABS: 500.0000 mg | ORAL_TABLET | Freq: Four times a day (QID) | ORAL | Status: DC | PRN

## 2013-05-19 MED ORDER — TRAMADOL HCL 50 MG PO TABS: 50.0000 mg | ORAL_TABLET | Freq: Four times a day (QID) | ORAL | Status: DC | PRN

## 2013-05-19 NOTE — Progress Notes (Signed)
Physical Therapy Treatment Note   05/19/13 1400  PT Visit Information  Last PT Received On 05/19/13  Assistance Needed +1  PT Time Calculation  PT Start Time 1405  PT Stop Time 1433  PT Time Calculation (min) 28 min  Subjective Data  Subjective I'm feeling a little high.  (took oxy extended release earlier and states she doesn't want it again)  Precautions  Precautions Fall;None  Precaution Comments no hip precautions  Restrictions  Other Position/Activity Restrictions WBAT  Cognition  Arousal/Alertness Awake/alert  Behavior During Therapy WFL for tasks assessed/performed  Overall Cognitive Status Within Functional Limits for tasks assessed  Bed Mobility  Bed Mobility Supine to Sit  Sit to Supine 4: Min assist;HOB flat  Details for Bed Mobility Assistance verbal cues for technique, pt attempted to use UEs to assist and trunk descended onto bed however assisted L LE for pt comfort, discussed using sheet next time for better control however pt too fatigued and nauseated to attempt at this time  Transfers  Transfers Sit to Stand;Stand to Sit  Sit to Stand 4: Min guard;With upper extremity assist;From chair/3-in-1  Stand to Sit 4: Min guard;With upper extremity assist;To chair/3-in-1;To bed  Details for Transfer Assistance verbal stated safe technique as pt performed  Ambulation/Gait  Ambulation/Gait Assistance 4: Min guard  Ambulation Distance (Feet) 180 Feet  Assistive device Rolling walker  Ambulation/Gait Assistance Details min/guard for safety due to pain meds, started step through gait pattern and attempting to increase step length  Gait Pattern Step-through pattern;Decreased step length - right  Gait velocity decreased  Stairs Yes  Stairs Assistance 4: Min guard  Stairs Assistance Details (indicate cue type and reason) verbal cues for safe technique using one rail, pt used both hands on L rail, performed twice, first time with PT then 2nd time with daughter (will assist  home)  Stair Management Technique One rail Left;Step to pattern;Forwards  Number of Stairs 2  PT - End of Session  Activity Tolerance Patient limited by fatigue  Patient left in bed;with call bell/phone within reach;with family/visitor present  Nurse Communication Other (comment) (aware of nausea end of session)  PT - Assessment/Plan  Comments on Treatment Session Pt ambulated in hallway and educated on safe stair technique.  Daughter present and educated on stairs as well.  Pt plans to d/c home tomorrow.  PT Plan Discharge plan remains appropriate;Frequency remains appropriate  Follow Up Recommendations Home health PT  PT equipment Rolling walker with 5" wheels  Acute Rehab PT Goals  PT Goal: Sit to Stand - Progress Progressing toward goal  PT Goal: Stand to Sit - Progress Progressing toward goal  PT Goal: Ambulate - Progress Progressing toward goal  PT Goal: Up/Down Stairs - Progress Progressing toward goal  PT General Charges  $$ ACUTE PT VISIT 1 Procedure  PT Treatments  $Gait Training 23-37 mins   Premedicated, repositioned, ice pack applied  Zenovia Jarred, PT, DPT 05/19/2013 Pager: [phone]

## 2013-05-19 NOTE — Evaluation (Signed)
Occupational Therapy Evaluation Patient Details Name: Victoria Ward MRN: [id number] DOB: 04-10-1946 Today's Date: 05/19/2013 Time: 4098-1191 OT Time Calculation (min): 23 min  OT Assessment / Plan / Recommendation Clinical Impression  Pt is s/p L direct anterior THA and currently is performing ADL overall at min to min guard assist. She will benefit from skilled OT services to increase independence with self care tasks for d/c home.    OT Assessment  Patient needs continued OT Services    Follow Up Recommendations  Supervision/Assistance - 24 hour;No OT follow up    Barriers to Discharge      Equipment Recommendations  3 in 1 bedside comode    Recommendations for Other Services    Frequency  Min 2X/week    Precautions / Restrictions Precautions Precautions: Fall;None Precaution Comments: no hip precautions Restrictions Other Position/Activity Restrictions: WBAT        ADL  Eating/Feeding: Independent Where Assessed - Eating/Feeding: Chair Grooming: Wash/dry hands;Min guard Where Assessed - Grooming: Unsupported standing Upper Body Bathing: Chest;Right arm;Left arm;Abdomen;Set up Where Assessed - Upper Body Bathing: Unsupported sitting Lower Body Bathing: Minimal assistance Where Assessed - Lower Body Bathing: Supported sit to stand Upper Body Dressing: Set up Where Assessed - Upper Body Dressing: Unsupported sitting Lower Body Dressing: Minimal assistance Where Assessed - Lower Body Dressing: Supported sit to stand Toilet Transfer: Hydrographic surveyor Method: Other (comment) (with walker into bathroom) Toilet Transfer Equipment: Raised toilet seat with arms (or 3-in-1 over toilet) Toileting - Clothing Manipulation and Hygiene: Min guard Where Assessed - Engineer, mining and Hygiene: Standing Equipment Used: Rolling walker ADL Comments: Pt stating she feels alittle more groggy than earlier this am but did well. Daughter present for session.  Educated both on AE and coverage. She only has a long shoe horn at home. Pt would benefit from practice of AE and states it took her increased time to dress LB at home PTA due to L hip being limited.     OT Diagnosis: Generalized weakness  OT Problem List: Decreased strength OT Treatment Interventions: Self-care/ADL training;Therapeutic activities;DME and/or AE instruction;Patient/family education   OT Goals Acute Rehab OT Goals OT Goal Formulation: With patient Time For Goal Achievement: 05/26/13 Potential to Achieve Goals: Good ADL Goals Pt Will Perform Grooming: with supervision;Standing at sink ADL Goal: Grooming - Progress: Goal set today Pt Will Perform Lower Body Bathing: with supervision;with adaptive equipment;Sit to stand from chair;Sit to stand from bed ADL Goal: Lower Body Bathing - Progress: Goal set today Pt Will Perform Lower Body Dressing: with supervision;Sit to stand from chair;Sit to stand from bed;with adaptive equipment ADL Goal: Lower Body Dressing - Progress: Goal set today Pt Will Transfer to Toilet: with supervision;Ambulation;3-in-1 ADL Goal: Toilet Transfer - Progress: Goal set today Pt Will Perform Toileting - Clothing Manipulation: with supervision;Standing ADL Goal: Toileting - Clothing Manipulation - Progress: Goal set today Pt Will Perform Tub/Shower Transfer: Shower transfer;with DME;with supervision ADL Goal: Tub/Shower Transfer - Progress: Goal set today  Visit Information  Last OT Received On: 05/19/13 Assistance Needed: +1    Subjective Data  Subjective: I just feel not quite as alert Patient Stated Goal: increase independence   Prior Functioning     Home Living Lives With: Spouse Type of Home: House Home Access: Stairs to enter Secretary/administrator of Steps: 4 Entrance Stairs-Rails: Right;Left Home Layout: One level Bathroom Shower/Tub: Health visitor: Handicapped height Home Adaptive Equipment: None;Built-in shower  seat;Long-handled shoehorn Prior Function Level of Independence: Independent  Communication Communication: No difficulties         Vision/Perception     Cognition  Cognition Arousal/Alertness: Awake/alert (pt states slightly groggy compared to earlier) Behavior During Therapy: WFL for tasks assessed/performed Overall Cognitive Status: Within Functional Limits for tasks assessed    Extremity/Trunk Assessment Right Upper Extremity Assessment RUE ROM/Strength/Tone: [facility] for tasks assessed Left Upper Extremity Assessment LUE ROM/Strength/Tone: WFL for tasks assessed     Mobility Bed Mobility Bed Mobility: Supine to Sit Supine to Sit: 5: Supervision;HOB elevated Details for Bed Mobility Assistance: verbal cues for technique, BP 133/57 mmHg and HR 94 bpm upon sitting EOB Transfers Transfers: Sit to Stand;Stand to Sit Sit to Stand: 4: Min guard;With upper extremity assist;From chair/3-in-1 Stand to Sit: 4: Min guard;With upper extremity assist;To chair/3-in-1 Details for Transfer Assistance: min verbal cues for hand placement        Balance Balance Balance Assessed: Yes Dynamic Standing Balance Dynamic Standing - Level of Assistance: 5: Stand by assistance   End of Session OT - End of Session Activity Tolerance: Patient tolerated treatment well Patient left: in chair;with call bell/phone within reach;with family/visitor present  GO     Lennox Laity [phone] 05/19/2013, 12:12 PM

## 2013-05-19 NOTE — Care Management Note (Addendum)
    Page 1 of 2   05/20/2013     10:14:26 AM   CARE MANAGEMENT NOTE 05/20/2013  Patient:  ILENA, DIECKMAN   Account Number:  [id number]  Date Initiated:  05/19/2013  Documentation initiated by:  Lanier Clam  Subjective/Objective Assessment:   ADMITTED W/OA L HIP.     Action/Plan:   FROM HOMEW/SPOUSE.   Anticipated DC Date:  05/20/2013   Anticipated DC Plan:  HOME W HOME HEALTH SERVICES      DC Planning Services  CM consult      Choice offered to / List presented to:  C-1 Patient   DME arranged  WALKER - ROLLING  BEDSIDE COMMODE      DME agency  [facility]     HH arranged  HH-2 PT      [facility] agency  [facility]   Status of service:  Completed, signed off Medicare Important Message given?   (If response is "NO", the following Medicare IM given date fields will be blank) Date Medicare IM given:   Date Additional Medicare IM given:    Discharge Disposition:  HOME W HOME HEALTH SERVICES  Per UR Regulation:    If discussed at Long Length of Stay Meetings, dates discussed:    Comments:  05/20/13 Merie Wulf RN,BSN NCM WEEKEND [phone] D/C HOME W/HH,& DME-[facility] AWARE OF D/C, & HH ORDERS-SPOKE TO [facility],THEY ARE ABLE TO PULL D/C SUMMARY,& HH ORDERS FROM [facility].DME ORDERS PLACED-DME REP JERMAINE AWARE OF DME NEEDED,& WILL BRING TO RM.  05/19/13 Ladona Rosten RN,BSN NCM WEEKEND [phone] PT-HH,RW.OT-NA,3N1.[facility] CHOSEN SINCE [facility] UNABLE TO STAFF.[facility] OFFICE AWARE OF REFERRAL FOR HHPT,RW,3N1 & FOLLOWING THROUGH [facility] FOR D/C.

## 2013-05-19 NOTE — Progress Notes (Signed)
Subjective: 1 Day Post-Op Procedure(s) (LRB): LEFT TOTAL HIP ARTHROPLASTY ANTERIOR APPROACH (Left) Awake alert oriented x4 in good spirits. Has been out of bed standing last evening. Patient reports pain as 5 on 0-10 scale.    Objective: Vital signs in last 24 hours: Temp:  [97.7 F (36.5 C)-99.7 F (37.6 C)] 98.7 F (37.1 C) (06/07 0459) Pulse Rate:  [45-101] 77 (06/07 0459) Resp:  [9-18] 14 (06/07 0459) BP: (63-127)/(30-96) 117/75 mmHg (06/07 0459) SpO2:  [97 %-100 %] 99 % (06/07 0459) Weight:  [66.679 kg (147 lb)] 66.679 kg (147 lb) (06/06 1127)  Intake/Output from previous day: 06/06 0701 - 06/07 0700 In: 3937.5 [I.V.:3387.5; IV Piggyback:550] Out: 4205 [Urine:3275; Drains:180; Blood:750] Intake/Output this shift: Total I/O In: 240 [P.O.:240] Out: -    Recent Labs  05/18/13 1320 05/19/13 0540  HGB 10.5* 9.7*    Recent Labs  05/18/13 1320 05/19/13 0540  WBC 9.3 7.8  RBC 3.44* 3.23*  HCT 31.4* 29.1*  PLT 175 158    Recent Labs  05/19/13 0540  NA 140  K 4.1  CL 106  CO2 31  BUN 13  CREATININE 0.71  GLUCOSE 111*  CALCIUM 8.5   No results found for this basename: LABPT, INR,  in the last 72 hours  Neurologically intact ABD soft Neurovascular intact Sensation intact distally Intact pulses distally Dorsiflexion/Plantar flexion intact Incision: dressing C/D/I and no drainage Hemovac discontinued left hip.  Assessment/Plan: 1 Day Post-Op Procedure(s) (LRB): LEFT TOTAL HIP ARTHROPLASTY ANTERIOR APPROACH (Left)  Advance diet Up with therapy D/C IV fluids Plan for discharge tomorrow or on Monday depending on her medical status and pain control.  NITKA,JAMES E 05/19/2013, 9:09 AM

## 2013-05-19 NOTE — Progress Notes (Signed)
Physical Therapy Treatment Patient Details Name: Victoria Ward MRN: [id number] DOB: 1946/02/25 Today's Date: 05/19/2013 Time: 4098-1191 PT Time Calculation (min): 38 min  PT Assessment / Plan / Recommendation Comments on Treatment Session  Pt ambulated in hallway, assisted to bathroom, and performed exercises.  Pt feeling better today and no dizziness with mobility.  Pt aware to call for assist with any needs/mobility for safety.    Follow Up Recommendations  Home health PT     Does the patient have the potential to tolerate intense rehabilitation     Barriers to Discharge        Equipment Recommendations  Rolling walker with 5" wheels    Recommendations for Other Services    Frequency     Plan Discharge plan remains appropriate;Frequency remains appropriate    Precautions / Restrictions Precautions Precautions: Fall;None Precaution Comments: no hip precautions Restrictions Other Position/Activity Restrictions: WBAT   Pertinent Vitals/Pain Premedicated, ice pack applied, pt reports very little pain    Mobility  Bed Mobility Bed Mobility: Supine to Sit Supine to Sit: 5: Supervision;HOB elevated Details for Bed Mobility Assistance: verbal cues for technique, BP 133/57 mmHg and HR 94 bpm upon sitting EOB Transfers Transfers: Sit to Stand;Stand to Sit Sit to Stand: 4: Min guard;With upper extremity assist;From bed;From chair/3-in-1 Stand to Sit: 4: Min guard;With upper extremity assist;To chair/3-in-1 Details for Transfer Assistance: verbal cues for safe technique esp hand placement in bathroom with toilet transfer Ambulation/Gait Ambulation/Gait Assistance: 4: Min guard Ambulation Distance (Feet): 160 Feet Assistive device: Rolling walker Ambulation/Gait Assistance Details: verbal cues for RW distance and sequence, pt denied dizziness (stated yesterday it was warmth and moist feeling prior to syncope and denied this with mobility today) Gait Pattern: Step-to  pattern;Decreased step length - left Gait velocity: decreased    Exercises Total Joint Exercises Gluteal Sets: AROM;Both;10 reps Heel Slides: AROM;Left;15 reps Hip ABduction/ADduction: AAROM;Left;15 reps Long Arc Quad: AROM;Left;10 reps Marching in Standing: AROM;15 reps;Seated;Limitations Marching in Standing Limitations: decreased active ROM   PT Diagnosis:    PT Problem List:   PT Treatment Interventions:     PT Goals Acute Rehab PT Goals PT Goal: Supine/Side to Sit - Progress: Progressing toward goal PT Goal: Sit to Stand - Progress: Progressing toward goal PT Goal: Stand to Sit - Progress: Progressing toward goal PT Goal: Ambulate - Progress: Progressing toward goal PT Goal: Perform Home Exercise Program - Progress: Progressing toward goal  Visit Information  Last PT Received On: 05/19/13 Assistance Needed: +1    Subjective Data  Subjective: I remember how we got up yesterday but I couldn't remember getting back in bed. Patient Stated Goal: home   Cognition  Cognition Arousal/Alertness: Awake/alert Behavior During Therapy: WFL for tasks assessed/performed Overall Cognitive Status: Within Functional Limits for tasks assessed    Balance     End of Session PT - End of Session Activity Tolerance: Patient tolerated treatment well Patient left: in chair;with call bell/phone within reach;with family/visitor present   GP     Shannen [place],KATHrine E 05/19/2013, 10:15 AM Zenovia Jarred, PT, DPT 05/19/2013 Pager: [phone]

## 2013-05-20 DIAGNOSIS — D62 Acute posthemorrhagic anemia: Secondary | ICD-10-CM | POA: Diagnosis not present

## 2013-05-20 LAB — CBC
Hemoglobin: 9.3 g/dL — ABNORMAL LOW (ref 12.0–15.0)
MCHC: 33.3 g/dL (ref 30.0–36.0)
Platelets: 153 10*3/uL (ref 150–400)
RDW: 13.5 % (ref 11.5–15.5)

## 2013-05-20 NOTE — Discharge Summary (Signed)
Patient ID: Victoria Ward MRN: [id number] DOB/AGE: October 04, 1946 67 y.o.  Admit date: 05/18/2013 Discharge date: 05/20/2013  Admission Diagnoses:  Principal Problem:   Degenerative arthritis of hip Active Problems:   Postoperative anemia due to acute blood loss   Discharge Diagnoses:  Same  Past Medical History  Diagnosis Date  . Heart palpitations     HX OF PALPITATIONS WITH STRESS  . GERD (gastroesophageal reflux disease)   . H/O hiatal hernia   . Headache(784.0)     MIGRAINES WITH AURA- JUST OCCAS  . Arthritis     PAIN AND OA BOTH HIPS - LEFT IS WORSE.  PT HAS ARTHRITIS IN HER BACK  . 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 female who was admitted 05/18/2013 for operative treatment ofDegenerative arthritis of hip. Patient has severe unremitting pain that affects sleep, daily activities, and work/hobbies. After pre-op clearance the patient was taken to the operating room on 05/18/2013 and underwent  Procedure(s): LEFT TOTAL HIP ARTHROPLASTY ANTERIOR APPROACH.    Patient was given perioperative antibiotics: Anti-infectives   Start     Dose/Rate Route Frequency Ordered Stop   05/18/13 1400  clindamycin (CLEOCIN) IVPB 600 mg     600 mg 100 mL/hr over 30 Minutes Intravenous Every 6 hours 05/18/13 1145 05/18/13 2021   05/18/13 0518  ceFAZolin (ANCEF) IVPB 2 g/50 mL premix     2 g 100 mL/hr over 30 Minutes Intravenous On call to O.R. 05/18/13 0518 05/18/13 0739       Patient was given sequential compression devices, early ambulation, and chemoprophylaxis to prevent DVT.  Patient benefited maximally from hospital stay and there were no complications.    Recent vital signs: Patient Vitals for the past 24 hrs:  BP Temp Temp src Pulse Resp  SpO2  05/20/13 0421 106/45 mmHg 99.6 F (37.6 C) Oral 102 18 98 %  05/19/13 2102 126/57 mmHg 98.5 F (36.9 C) Oral 81 16 100 %     Recent laboratory studies:  Recent Labs  05/19/13 0540 05/20/13 0413  WBC 7.8 6.3  HGB 9.7* 9.3*  HCT 29.1* 27.9*  PLT 158 153  NA 140  --   K 4.1  --   CL 106  --   CO2 31  --   BUN 13  --   CREATININE 0.71  --   GLUCOSE 111*  --   CALCIUM 8.5  --      Discharge Medications:     Medication List    TAKE these medications       acetaminophen 500 MG tablet  Commonly known as:  TYLENOL  Take 500 mg by mouth every 6 (six) hours as needed for pain.     aspirin 325 MG EC tablet  Take 1 tablet (325 mg total) by mouth 2 (two) times daily after a meal.     methocarbamol 500 MG tablet  Commonly known as:  ROBAXIN  Take 1 tablet (500 mg total) by mouth every 6 (six) hours as needed.     pantoprazole 40 MG tablet  Commonly known as:  PROTONIX  Take 40 mg by mouth daily before breakfast.     traMADol 50 MG tablet  Commonly known as:  ULTRAM  Take 1 tablet (50 mg total)  by mouth every 6 (six) hours as needed for pain.        Diagnostic Studies: Dg Hip Complete Left  05/18/2013   *RADIOLOGY REPORT*  Clinical Data: Left total hip arthroplasty.  LEFT HIP - COMPLETE 2+ VIEW  Comparison: 06/10/2011  Findings: Fluoroscopic spot images demonstrate well seated components of a total left hip arthroplasty.  No complicating features.  IMPRESSION: Well seated components of a total left hip arthroplasty.   Original Report Authenticated By: Rudie Meyer, M.D.   Dg Pelvis Portable  05/18/2013   *RADIOLOGY REPORT*  Clinical Data: Postop left.  PORTABLE LEFT HIP - 1 VIEW,PORTABLE PELVIS  Comparison: 12/29/2012.  Findings: Post total left hip replacement which appears in satisfactory position without complication noted.  Surgical drain is in place.  Prominent right hip joint degenerative changes.  Degenerative changes L5-S1.  IMPRESSION: Post total left hip  replacement which appears in satisfactory position without complication noted.   Original Report Authenticated By: Lacy Duverney, M.D.   Dg Hip Portable 1 View Left  05/18/2013   *RADIOLOGY REPORT*  Clinical Data: Postop left.  PORTABLE LEFT HIP - 1 VIEW,PORTABLE PELVIS  Comparison: 12/29/2012.  Findings: Post total left hip replacement which appears in satisfactory position without complication noted.  Surgical drain is in place.  Prominent right hip joint degenerative changes.  Degenerative changes L5-S1.  IMPRESSION: Post total left hip replacement which appears in satisfactory position without complication noted.   Original Report Authenticated By: Lacy Duverney, M.D.   Dg C-arm 1-60 Min-no Report  05/18/2013   CLINICAL DATA: Surgery   C-ARM 1-60 MINUTES  Fluoroscopy was utilized by the requesting physician.  No radiographic  interpretation.     Disposition: 01-Home or Self Care      Discharge Orders   Future Orders Complete By Expires     Call MD / Call 911  As directed     Comments:      If you experience chest pain or shortness of breath, CALL 911 and be transported to the hospital emergency room.  If you develope a fever above 101 F, pus (white drainage) or increased drainage or redness at the wound, or calf pain, call your surgeon's office.    Call MD / Call 911  As directed     Comments:      If you experience chest pain or shortness of breath, CALL 911 and be transported to the hospital emergency room.  If you develope a fever above 101 F, pus (white drainage) or increased drainage or redness at the wound, or calf pain, call your surgeon's office.    Constipation Prevention  As directed     Comments:      Drink plenty of fluids.  Prune juice may be helpful.  You may use a stool softener, such as Colace (over the counter) 100 mg twice a day.  Use MiraLax (over the counter) for constipation as needed.    Constipation Prevention  As directed     Comments:      Drink plenty of fluids.   Prune juice may be helpful.  You may use a stool softener, such as Colace (over the counter) 100 mg twice a day.  Use MiraLax (over the counter) for constipation as needed.    Diet - low sodium heart healthy  As directed     Diet - low sodium heart healthy  As directed     Discharge instructions  As directed     Comments:  Increase your activities as comfort allows. Expect left leg swelling. Ice and elevation as needed. You can shower with your current dressing in place. You can remove your current dressing 05/25/13 and get your actual incision wet in the shower    Discharge instructions  As directed     Comments:      Keep hip incision dry for 5 days post op then may wet while bathing. Therapy daily . Call if fever or chills or increased drainage. Go to ER if acutely short of breath or call for ambulance. Return for follow up in 2 weeks. May full weight bear on the surgical leg unless told otherwise. In house walking for first 2 weeks.    Driving restrictions  As directed     Comments:      No driving for 4 weeks    Follow the hip precautions as taught in Physical Therapy  As directed     Increase activity slowly as tolerated  As directed     Increase activity slowly as tolerated  As directed     Lifting restrictions  As directed     Comments:      No lifting for 6 weeks       Follow-up Information   Follow up with Kathryne Hitch, MD In 2 weeks.   Contact information:   [address] [place] [address] [phone]        Signed: Kathryne Hitch 05/20/2013, 4:28 PM

## 2013-05-20 NOTE — Progress Notes (Signed)
Physical Therapy Treatment Patient Details Name: Victoria Ward MRN: [id number] DOB: 1946/07/12 Today's Date: 05/20/2013 Time: 4098-1191 PT Time Calculation (min): 26 min  PT Assessment / Plan / Recommendation Comments on Treatment Session  Pt ambulated in hallway and performed standing and sitting exercises.  Exercises standing performed with UE support and pt educated on safety at home with standing exercises.  Pt stated she did not need to practice stairs again so reviewed safe technique verbally.  Pt feels ready for d/c home.    Follow Up Recommendations  Home health PT     Does the patient have the potential to tolerate intense rehabilitation     Barriers to Discharge        Equipment Recommendations  Rolling walker with 5" wheels    Recommendations for Other Services    Frequency     Plan Discharge plan remains appropriate;Frequency remains appropriate    Precautions / Restrictions Precautions Precautions: Fall;None Precaution Comments: no hip precautions Restrictions Other Position/Activity Restrictions: WBAT   Pertinent Vitals/Pain Ice applied to L hip, pt reports good pain control with meds    Mobility  Bed Mobility Bed Mobility: Supine to Sit Supine to Sit: 6: Modified independent (Device/Increase time) Details for Bed Mobility Assistance: increased time Transfers Transfers: Sit to Stand;Stand to Sit Sit to Stand: 5: Supervision;With upper extremity assist;From bed;From chair/3-in-1 Stand to Sit: 5: Supervision;With upper extremity assist;To bed;To chair/3-in-1 Details for Transfer Assistance: demonstrates safe technique Ambulation/Gait Ambulation/Gait Assistance: 5: Supervision Ambulation Distance (Feet): 260 Feet Assistive device: Rolling walker Ambulation/Gait Assistance Details: doing very well with ambulation, discussed progression to [facility] with HHPT Gait Pattern: Step-through pattern;Decreased step length - right    Exercises Total Joint  Exercises Gluteal Sets: AROM;20 reps;Both;Seated Hip ABduction/ADduction: AROM;20 reps;Left;Standing Straight Leg Raises: AAROM;Left;15 reps;Supine Long Arc Quad: AROM;20 reps;Both;Seated Marching in Standing: AROM;Left;20 reps;Standing General Exercises - Lower Extremity Heel Raises: AROM;Both;20 reps;Standing Other Exercises Other Exercises: ham curls x20 on left standing at sink   PT Diagnosis:    PT Problem List:   PT Treatment Interventions:     PT Goals Acute Rehab PT Goals PT Goal: Supine/Side to Sit - Progress: Met PT Goal: Sit to Stand - Progress: Progressing toward goal PT Goal: Stand to Sit - Progress: Progressing toward goal PT Goal: Ambulate - Progress: Progressing toward goal PT Goal: Perform Home Exercise Program - Progress: Progressing toward goal  Visit Information  Last PT Received On: 05/20/13 Assistance Needed: +1    Subjective Data  Subjective: I'm ready to go home today.   Cognition  Cognition Arousal/Alertness: Awake/alert Behavior During Therapy: WFL for tasks assessed/performed Overall Cognitive Status: Within Functional Limits for tasks assessed    Balance     End of Session PT - End of Session Activity Tolerance: Patient tolerated treatment well Patient left: in chair;with call bell/phone within reach;with family/visitor present   GP     Victoria Ward,Victoria Ward 05/20/2013, 1:15 PM Zenovia Jarred, PT, DPT 05/20/2013 Pager: [phone]

## 2013-05-20 NOTE — Progress Notes (Addendum)
Subjective: 2 Days Post-Op Procedure(s) (LRB): LEFT TOTAL HIP ARTHROPLASTY ANTERIOR APPROACH (Left) He is awake alert oriented x4 in good spirits. Up with therapy therapy reports she is doing quite well. Really go home today. Does not wish take any narcotic medicines she reports that they nauseate her. Tramadol is available per Dr. Magnus Ivan. Patient reports pain as 3 on 0-10 scale.    Objective: Vital signs in last 24 hours: Temp:  [98.5 F (36.9 C)-99.6 F (37.6 C)] 99.6 F (37.6 C) (06/08 0421) Pulse Rate:  [81-102] 102 (06/08 0421) Resp:  [16-18] 18 (06/08 0421) BP: (97-126)/(45-57) 106/45 mmHg (06/08 0421) SpO2:  [98 %-100 %] 98 % (06/08 0421)  Intake/Output from previous day: 06/07 0701 - 06/08 0700 In: 847.2 [P.O.:360; I.V.:487.2] Out: -  Intake/Output this shift: Total I/O In: 360 [P.O.:360] Out: -    Recent Labs  05/18/13 1320 05/19/13 0540 05/20/13 0413  HGB 10.5* 9.7* 9.3*    Recent Labs  05/19/13 0540 05/20/13 0413  WBC 7.8 6.3  RBC 3.23* 3.04*  HCT 29.1* 27.9*  PLT 158 153    Recent Labs  05/19/13 0540  NA 140  K 4.1  CL 106  CO2 31  BUN 13  CREATININE 0.71  GLUCOSE 111*  CALCIUM 8.5   No results found for this basename: LABPT, INR,  in the last 72 hours  Neurologically intact ABD soft Neurovascular intact Sensation intact distally Intact pulses distally Dorsiflexion/Plantar flexion intact Incision: scant drainage  Assessment/Plan: 2 Days Post-Op Procedure(s) (LRB): LEFT TOTAL HIP ARTHROPLASTY ANTERIOR APPROACH (Left) Anemia of acute blood loss, perioperative, mild.  Advance diet Up with therapy Discharge home with home health  Alyssa Mancera E 05/20/2013, 9:35 AM

## 2013-05-20 NOTE — Progress Notes (Signed)
Pt stable, scripts, d/c instructions, and equipment given with no questions/concerns voiced by pt or husband.  Pt transported via wheelchair to private vehicle by NT and husband.

## 2013-05-21 ENCOUNTER — Encounter (HOSPITAL_COMMUNITY): Payer: Self-pay | Admitting: Orthopaedic Surgery

## 2014-04-29 ENCOUNTER — Other Ambulatory Visit (HOSPITAL_COMMUNITY): Payer: Self-pay

## 2014-05-03 ENCOUNTER — Encounter (HOSPITAL_COMMUNITY): Payer: Self-pay | Admitting: Pharmacy Technician

## 2014-05-08 ENCOUNTER — Encounter (HOSPITAL_COMMUNITY)
Admission: RE | Admit: 2014-05-08 | Discharge: 2014-05-08 | Disposition: A | Discharge status: Home / Self Care | Payer: Medicare Other | Source: Ambulatory Visit | Attending: Orthopaedic Surgery | Admitting: Orthopaedic Surgery

## 2014-05-08 ENCOUNTER — Encounter (HOSPITAL_COMMUNITY): Payer: Self-pay

## 2014-05-08 DIAGNOSIS — Z01812 Encounter for preprocedural laboratory examination: Secondary | ICD-10-CM | POA: Insufficient documentation

## 2014-05-08 DIAGNOSIS — IMO0002 Reserved for concepts with insufficient information to code with codable children: Secondary | ICD-10-CM

## 2014-05-08 DIAGNOSIS — Z01810 Encounter for preprocedural cardiovascular examination: Secondary | ICD-10-CM | POA: Insufficient documentation

## 2014-05-08 HISTORY — DX: Reserved for concepts with insufficient information to code with codable children: IMO0002

## 2014-05-08 LAB — PROTIME-INR
INR: 0.92 (ref 0.00–1.49)
Prothrombin Time: 12.2 seconds (ref 11.6–15.2)

## 2014-05-08 LAB — URINALYSIS, ROUTINE W REFLEX MICROSCOPIC
BILIRUBIN URINE: NEGATIVE
GLUCOSE, UA: NEGATIVE mg/dL
HGB URINE DIPSTICK: NEGATIVE
Ketones, ur: NEGATIVE mg/dL
Leukocytes, UA: NEGATIVE
Nitrite: NEGATIVE
Protein, ur: NEGATIVE mg/dL
SPECIFIC GRAVITY, URINE: 1.014 (ref 1.005–1.030)
Urobilinogen, UA: 0.2 mg/dL (ref 0.0–1.0)
pH: 7 (ref 5.0–8.0)

## 2014-05-08 LAB — CBC
HEMATOCRIT: 41.7 % (ref 36.0–46.0)
HEMOGLOBIN: 13.7 g/dL (ref 12.0–15.0)
MCH: 29.9 pg (ref 26.0–34.0)
MCHC: 32.9 g/dL (ref 30.0–36.0)
MCV: 91 fL (ref 78.0–100.0)
Platelets: 177 10*3/uL (ref 150–400)
RBC: 4.58 MIL/uL (ref 3.87–5.11)
RDW: 13.1 % (ref 11.5–15.5)
WBC: 3.2 10*3/uL — AB (ref 4.0–10.5)

## 2014-05-08 LAB — BASIC METABOLIC PANEL
BUN: 13 mg/dL (ref 6–23)
CHLORIDE: 105 meq/L (ref 96–112)
CO2: 30 meq/L (ref 19–32)
CREATININE: 0.68 mg/dL (ref 0.50–1.10)
Calcium: 9 mg/dL (ref 8.4–10.5)
GFR calc non Af Amer: 88 mL/min — ABNORMAL LOW (ref >90)
Glucose, Bld: 83 mg/dL (ref 70–99)
POTASSIUM: 4.4 meq/L (ref 3.7–5.3)
Sodium: 143 mEq/L (ref 137–147)

## 2014-05-08 LAB — SURGICAL PCR SCREEN
MRSA, PCR: NEGATIVE
Staphylococcus aureus: NEGATIVE

## 2014-05-08 LAB — APTT: APTT: 32 s (ref 24–37)

## 2014-05-08 NOTE — Patient Instructions (Addendum)
20 Victoria RalphsSusan Ward  05/08/2014   Your procedure is scheduled on: 6-5  -2015  [facility] and [facility]. Arrive at     0530   AM.  Call this number if you have problems the morning of surgery: [phone]  Or Presurgical Testing [phone](Masako Overall) For Living Will and/or Health Care Power Attorney Forms: please provide copy for your medical record,may bring AM of surgery(Forms should be already notarized -we do not provide this service).[phone] information preferred today.     Do not eat food:After Midnight.      Take these medicines the morning of surgery with A SIP OF WATER: Pantoprazole.   Do not wear jewelry, make-up or nail polish.  Do not wear lotions, powders, or perfumes. You may wear deodorant.  Do not shave 48 hours(2 days) prior to first CHG shower(legs and under arms).(Shaving face and neck okay.)  Do not bring valuables to the hospital.(Hospital is not responsible for lost valuables).  Contacts, dentures or removable bridgework, body piercing, hair pins may not be worn into surgery.  Leave suitcase in the car. After surgery it may be brought to your room.  For patients admitted to the hospital, checkout time is 11:00 AM the day of discharge.(Restricted visitors-Any Persons displaying flu-like symptoms or illness).    Patients discharged the day of surgery will not be allowed to drive home. Must have responsible person with you x 24 hours once discharged.  Name and phone number of your driver: Howard-spouse [phone] h  Special Instructions: CHG(Chlorhedine 4%-"Hibiclens","Betasept","Aplicare") Shower Use Special Wash: see special instructions.(avoid face and genitals)   Please read over the following fact sheets that you were given: MRSA Information, Blood Transfusion fact sheet.  Remember : Type/Screen "Blue armbands" - may not be removed once applied(would result in being retested AM of surgery, if  removed).  Failure to follow these instructions may result in Cancellation of your surgery.  ___                                                                                                                                       [facility] - Preparing for Surgery Before surgery, you can play an important role.  Because skin is not sterile, your skin needs to be as free of germs as possible.  You can reduce the number of germs on your skin by washing with CHG (chlorahexidine gluconate) soap before surgery.  CHG is an antiseptic cleaner which kills germs and bonds with the skin to continue killing germs even after washing. Please DO NOT use if you have an allergy to CHG or antibacterial soaps.  If your skin becomes reddened/irritated stop using the CHG and inform your nurse when you arrive at Short Stay. Do not shave (including legs and underarms) for at least 48 hours prior to the first CHG shower.  You may shave your  face/neck. Please follow these instructions carefully:  1.  Shower with CHG Soap the night before surgery and the  morning of Surgery.  2.  If you choose to wash your hair, wash your hair first as usual with your  normal  shampoo.  3.  After you shampoo, rinse your hair and body thoroughly to remove the  shampoo.                           4.  Use CHG as you would any other liquid soap.  You can apply chg directly  to the skin and wash                       Gently with a scrungie or clean washcloth.  5.  Apply the CHG Soap to your body ONLY FROM THE NECK DOWN.   Do not use on face/ open                           Wound or open sores. Avoid contact with eyes, ears mouth and genitals (private parts).                       Wash face,  Genitals (private parts) with your normal soap.             6.  Wash thoroughly, paying special attention to the area where your surgery  will be performed.  7.  Thoroughly rinse your body with warm water from the neck down.  8.  DO NOT shower/wash with  your normal soap after using and rinsing off  the CHG Soap.                9.  Pat yourself dry with a clean towel.            10.  Wear clean pajamas.            11.  Place clean sheets on your bed the night of your first shower and do not  sleep with pets. Day of Surgery : Do not apply any lotions/deodorants the morning of surgery.  Please wear clean clothes to the hospital/surgery center.  FAILURE TO FOLLOW THESE INSTRUCTIONS MAY RESULT IN THE CANCELLATION OF YOUR SURGERY PATIENT SIGNATURE_________________________________  NURSE SIGNATURE__________________________________  ________________________________________________________________________  WHAT IS A BLOOD TRANSFUSION? Blood Transfusion Information  A transfusion is the replacement of blood or some of its parts. Blood is made up of multiple cells which provide different functions.  Red blood cells carry oxygen and are used for blood loss replacement.  White blood cells fight against infection.  Platelets control bleeding.  Plasma helps clot blood.  Other blood products are available for specialized needs, such as hemophilia or other clotting disorders. BEFORE THE TRANSFUSION  Who gives blood for transfusions?   Healthy volunteers who are fully evaluated to make sure their blood is safe. This is blood bank blood. Transfusion therapy is the safest it has ever been in the practice of medicine. Before blood is taken from a donor, a complete history is taken to make sure that person has no history of diseases nor engages in risky social behavior (examples are intravenous drug use or sexual activity with multiple partners). The donor's travel history is screened to minimize risk of transmitting infections, such as malaria. The donated blood is tested for signs of infectious diseases, such as  HIV and hepatitis. The blood is then tested to be sure it is compatible with you in order to minimize the chance of a transfusion reaction. If  you or a relative donates blood, this is often done in anticipation of surgery and is not appropriate for emergency situations. It takes many days to process the donated blood. RISKS AND COMPLICATIONS Although transfusion therapy is very safe and saves many lives, the main dangers of transfusion include:   Getting an infectious disease.  Developing a transfusion reaction. This is an allergic reaction to something in the blood you were given. Every precaution is taken to prevent this. The decision to have a blood transfusion has been considered carefully by your caregiver before blood is given. Blood is not given unless the benefits outweigh the risks. AFTER THE TRANSFUSION  Right after receiving a blood transfusion, you will usually feel much better and more energetic. This is especially true if your red blood cells have gotten low (anemic). The transfusion raises the level of the red blood cells which carry oxygen, and this usually causes an energy increase.  The nurse administering the transfusion will monitor you carefully for complications. HOME CARE INSTRUCTIONS  No special instructions are needed after a transfusion. You may find your energy is better. Speak with your caregiver about any limitations on activity for underlying diseases you may have. SEEK MEDICAL CARE IF:   Your condition is not improving after your transfusion.  You develop redness or irritation at the intravenous (IV) site. SEEK IMMEDIATE MEDICAL CARE IF:  Any of the following symptoms occur over the next 12 hours:  Shaking chills.  You have a temperature by mouth above 102 F (38.9 C), not controlled by medicine.  Chest, back, or muscle pain.  People around you feel you are not acting correctly or are confused.  Shortness of breath or difficulty breathing.  Dizziness and fainting.  You get a rash or develop hives.  You have a decrease in urine output.  Your urine turns a dark color or changes to pink,  red, or brown. Any of the following symptoms occur over the next 10 days:  You have a temperature by mouth above 102 F (38.9 C), not controlled by medicine.  Shortness of breath.  Weakness after normal activity.  The white part of the eye turns yellow (jaundice).  You have a decrease in the amount of urine or are urinating less often.  Your urine turns a dark color or changes to pink, red, or brown. Document Released: 11/26/2000 Document Revised: 02/21/2012 Document Reviewed: 07/15/2008 [facility] Patient Information 2014 [place], [place].  _______________________________________________________________________

## 2014-05-08 NOTE — Pre-Procedure Instructions (Signed)
05-08-14 EKG done today

## 2014-05-17 ENCOUNTER — Encounter (HOSPITAL_COMMUNITY): Payer: Self-pay | Admitting: *Deleted

## 2014-05-17 ENCOUNTER — Inpatient Hospital Stay (HOSPITAL_COMMUNITY)
Admission: RE | Admit: 2014-05-17 | Discharge: 2014-05-19 | DRG: 470 | Disposition: A | Discharge status: Home Health | Payer: Medicare Other | Source: Ambulatory Visit | Attending: Orthopaedic Surgery | Admitting: Orthopaedic Surgery

## 2014-05-17 ENCOUNTER — Inpatient Hospital Stay (HOSPITAL_COMMUNITY): Payer: Medicare Other

## 2014-05-17 ENCOUNTER — Inpatient Hospital Stay (HOSPITAL_COMMUNITY): Payer: Medicare Other | Admitting: Anesthesiology

## 2014-05-17 ENCOUNTER — Encounter (HOSPITAL_COMMUNITY)
Admission: RE | Disposition: A | Discharge status: Home Health | Payer: Self-pay | Source: Ambulatory Visit | Attending: Orthopaedic Surgery

## 2014-05-17 ENCOUNTER — Encounter (HOSPITAL_COMMUNITY): Payer: Medicare Other | Admitting: Anesthesiology

## 2014-05-17 DIAGNOSIS — M1610 Unilateral primary osteoarthritis, unspecified hip: Principal | ICD-10-CM | POA: Diagnosis present

## 2014-05-17 DIAGNOSIS — M169 Osteoarthritis of hip, unspecified: Principal | ICD-10-CM | POA: Diagnosis present

## 2014-05-17 DIAGNOSIS — Z96649 Presence of unspecified artificial hip joint: Secondary | ICD-10-CM

## 2014-05-17 DIAGNOSIS — Z01812 Encounter for preprocedural laboratory examination: Secondary | ICD-10-CM

## 2014-05-17 DIAGNOSIS — M1611 Unilateral primary osteoarthritis, right hip: Secondary | ICD-10-CM

## 2014-05-17 DIAGNOSIS — K449 Diaphragmatic hernia without obstruction or gangrene: Secondary | ICD-10-CM | POA: Diagnosis present

## 2014-05-17 DIAGNOSIS — K219 Gastro-esophageal reflux disease without esophagitis: Secondary | ICD-10-CM | POA: Diagnosis present

## 2014-05-17 HISTORY — PX: TOTAL HIP ARTHROPLASTY: SHX124

## 2014-05-17 LAB — TYPE AND SCREEN
ABO/RH(D): O POS
ANTIBODY SCREEN: NEGATIVE

## 2014-05-17 SURGERY — ARTHROPLASTY, HIP, TOTAL, ANTERIOR APPROACH
Anesthesia: Spinal | Site: Hip | Laterality: Right

## 2014-05-17 MED ORDER — POLYETHYLENE GLYCOL 3350 17 G PO PACK: 17.0000 g | PACK | Freq: Every day | ORAL | Status: DC | PRN

## 2014-05-17 MED ORDER — PROPOFOL 10 MG/ML IV BOLUS
INTRAVENOUS | Status: AC
  Filled 2014-05-17: qty 20

## 2014-05-17 MED ORDER — MENTHOL 3 MG MT LOZG
1.0000 | LOZENGE | OROMUCOSAL | Status: DC | PRN
Start: 2014-05-17 — End: 2014-05-19
  Filled 2014-05-17: qty 9

## 2014-05-17 MED ORDER — MORPHINE SULFATE 2 MG/ML IJ SOLN: 2.0000 mg | INTRAMUSCULAR | Status: DC | PRN

## 2014-05-17 MED ORDER — DOCUSATE SODIUM 100 MG PO CAPS
100.0000 mg | ORAL_CAPSULE | Freq: Two times a day (BID) | ORAL | Status: DC
  Administered 2014-05-17 – 2014-05-18 (×3): 100 mg via ORAL

## 2014-05-17 MED ORDER — ONDANSETRON HCL 4 MG/2ML IJ SOLN: 4.0000 mg | Freq: Four times a day (QID) | INTRAMUSCULAR | Status: DC | PRN

## 2014-05-17 MED ORDER — OXYCODONE HCL 5 MG PO TABS
5.0000 mg | ORAL_TABLET | ORAL | Status: DC | PRN
Start: 2014-05-17 — End: 2014-05-19

## 2014-05-17 MED ORDER — BUPIVACAINE HCL (PF) 0.5 % IJ SOLN
INTRAMUSCULAR | Status: AC
  Filled 2014-05-17: qty 30

## 2014-05-17 MED ORDER — DIPHENHYDRAMINE HCL 12.5 MG/5ML PO ELIX: 12.5000 mg | ORAL_SOLUTION | ORAL | Status: DC | PRN

## 2014-05-17 MED ORDER — MEPERIDINE HCL 50 MG/ML IJ SOLN: 6.2500 mg | INTRAMUSCULAR | Status: DC | PRN

## 2014-05-17 MED ORDER — TRANEXAMIC ACID 100 MG/ML IV SOLN
1000.0000 mg | INTRAVENOUS | Status: AC
  Administered 2014-05-17: 1000 mg via INTRAVENOUS
  Filled 2014-05-17: qty 10

## 2014-05-17 MED ORDER — LIDOCAINE HCL (CARDIAC) 20 MG/ML IV SOLN
INTRAVENOUS | Status: AC
  Filled 2014-05-17: qty 5

## 2014-05-17 MED ORDER — KETAMINE HCL 10 MG/ML IJ SOLN
INTRAMUSCULAR | Status: DC | PRN
  Administered 2014-05-17 (×5): 5 mg via INTRAVENOUS

## 2014-05-17 MED ORDER — CLINDAMYCIN PHOSPHATE 600 MG/50ML IV SOLN
600.0000 mg | Freq: Four times a day (QID) | INTRAVENOUS | Status: AC
  Administered 2014-05-17 (×2): 600 mg via INTRAVENOUS
  Filled 2014-05-17 (×2): qty 50

## 2014-05-17 MED ORDER — PROPOFOL 10 MG/ML IV EMUL
INTRAVENOUS | Status: DC | PRN
  Administered 2014-05-17: 20 mg via INTRAVENOUS

## 2014-05-17 MED ORDER — TRAMADOL HCL 50 MG PO TABS: 100.0000 mg | ORAL_TABLET | Freq: Four times a day (QID) | ORAL | Status: DC | PRN

## 2014-05-17 MED ORDER — FENTANYL CITRATE 0.05 MG/ML IJ SOLN: 25.0000 ug | INTRAMUSCULAR | Status: DC | PRN

## 2014-05-17 MED ORDER — KETAMINE HCL 10 MG/ML IJ SOLN
INTRAMUSCULAR | Status: AC
  Filled 2014-05-17: qty 1

## 2014-05-17 MED ORDER — FERROUS SULFATE 325 (65 FE) MG PO TABS
325.0000 mg | ORAL_TABLET | Freq: Three times a day (TID) | ORAL | Status: DC
  Administered 2014-05-17 – 2014-05-19 (×5): 325 mg via ORAL
  Filled 2014-05-17 (×9): qty 1

## 2014-05-17 MED ORDER — METHOCARBAMOL 1000 MG/10ML IJ SOLN
500.0000 mg | Freq: Four times a day (QID) | INTRAVENOUS | Status: DC | PRN
  Filled 2014-05-17: qty 5

## 2014-05-17 MED ORDER — CLINDAMYCIN PHOSPHATE 900 MG/50ML IV SOLN
900.0000 mg | INTRAVENOUS | Status: AC
  Administered 2014-05-17: 900 mg via INTRAVENOUS

## 2014-05-17 MED ORDER — SODIUM CHLORIDE 0.9 % IJ SOLN
INTRAMUSCULAR | Status: AC
  Filled 2014-05-17: qty 10

## 2014-05-17 MED ORDER — ACETAMINOPHEN 650 MG RE SUPP: 650.0000 mg | Freq: Four times a day (QID) | RECTAL | Status: DC | PRN

## 2014-05-17 MED ORDER — ACETAMINOPHEN 325 MG PO TABS
650.0000 mg | ORAL_TABLET | Freq: Four times a day (QID) | ORAL | Status: DC | PRN
  Administered 2014-05-17 – 2014-05-19 (×7): 650 mg via ORAL
  Filled 2014-05-17 (×7): qty 2

## 2014-05-17 MED ORDER — CLINDAMYCIN PHOSPHATE 900 MG/50ML IV SOLN
INTRAVENOUS | Status: AC
  Filled 2014-05-17: qty 50

## 2014-05-17 MED ORDER — METHOCARBAMOL 500 MG PO TABS: 500.0000 mg | ORAL_TABLET | Freq: Four times a day (QID) | ORAL | Status: DC | PRN

## 2014-05-17 MED ORDER — PHENOL 1.4 % MT LIQD
1.0000 | OROMUCOSAL | Status: DC | PRN
  Filled 2014-05-17: qty 177

## 2014-05-17 MED ORDER — EPHEDRINE SULFATE 50 MG/ML IJ SOLN
INTRAMUSCULAR | Status: AC
  Filled 2014-05-17: qty 1

## 2014-05-17 MED ORDER — MIDAZOLAM HCL 5 MG/5ML IJ SOLN
INTRAMUSCULAR | Status: DC | PRN
  Administered 2014-05-17: 2 mg via INTRAVENOUS

## 2014-05-17 MED ORDER — LACTATED RINGERS IV SOLN
INTRAVENOUS | Status: DC | PRN
  Administered 2014-05-17 (×3): via INTRAVENOUS

## 2014-05-17 MED ORDER — ACETAMINOPHEN 10 MG/ML IV SOLN
1000.0000 mg | Freq: Once | INTRAVENOUS | Status: AC
  Administered 2014-05-17: 1000 mg via INTRAVENOUS
  Filled 2014-05-17: qty 100

## 2014-05-17 MED ORDER — ONDANSETRON HCL 4 MG PO TABS: 4.0000 mg | ORAL_TABLET | Freq: Four times a day (QID) | ORAL | Status: DC | PRN

## 2014-05-17 MED ORDER — MIDAZOLAM HCL 2 MG/2ML IJ SOLN
INTRAMUSCULAR | Status: AC
  Filled 2014-05-17: qty 2

## 2014-05-17 MED ORDER — BUPIVACAINE HCL (PF) 0.5 % IJ SOLN
INTRAMUSCULAR | Status: DC | PRN
  Administered 2014-05-17: 3 mL

## 2014-05-17 MED ORDER — ONDANSETRON HCL 4 MG/2ML IJ SOLN
INTRAMUSCULAR | Status: AC
  Filled 2014-05-17: qty 2

## 2014-05-17 MED ORDER — ONDANSETRON HCL 4 MG/2ML IJ SOLN
INTRAMUSCULAR | Status: DC | PRN
  Administered 2014-05-17: 4 mg via INTRAVENOUS

## 2014-05-17 MED ORDER — EPHEDRINE SULFATE 50 MG/ML IJ SOLN
INTRAMUSCULAR | Status: DC | PRN
  Administered 2014-05-17: 5 mg via INTRAVENOUS

## 2014-05-17 MED ORDER — METOCLOPRAMIDE HCL 5 MG/ML IJ SOLN
5.0000 mg | Freq: Three times a day (TID) | INTRAMUSCULAR | Status: DC | PRN
Start: 2014-05-17 — End: 2014-05-19

## 2014-05-17 MED ORDER — ASPIRIN EC 325 MG PO TBEC
325.0000 mg | DELAYED_RELEASE_TABLET | Freq: Two times a day (BID) | ORAL | Status: DC
  Administered 2014-05-17 – 2014-05-19 (×4): 325 mg via ORAL
  Filled 2014-05-17 (×6): qty 1

## 2014-05-17 MED ORDER — 0.9 % SODIUM CHLORIDE (POUR BTL) OPTIME
TOPICAL | Status: DC | PRN
  Administered 2014-05-17: 1000 mL

## 2014-05-17 MED ORDER — PANTOPRAZOLE SODIUM 40 MG PO TBEC
40.0000 mg | DELAYED_RELEASE_TABLET | Freq: Every day | ORAL | Status: DC
  Administered 2014-05-18 – 2014-05-19 (×2): 40 mg via ORAL
  Filled 2014-05-17 (×3): qty 1

## 2014-05-17 MED ORDER — PROPOFOL INFUSION 10 MG/ML OPTIME
INTRAVENOUS | Status: DC | PRN
Start: 2014-05-17 — End: 2014-05-17
  Administered 2014-05-17: 100 ug/kg/min via INTRAVENOUS

## 2014-05-17 MED ORDER — SODIUM CHLORIDE 0.9 % IV SOLN
INTRAVENOUS | Status: DC
  Administered 2014-05-17 – 2014-05-18 (×2): via INTRAVENOUS

## 2014-05-17 MED ORDER — SODIUM CHLORIDE 0.9 % IR SOLN
Status: DC | PRN
  Administered 2014-05-17: 1000 mL

## 2014-05-17 MED ORDER — METOCLOPRAMIDE HCL 10 MG PO TABS: 5.0000 mg | ORAL_TABLET | Freq: Three times a day (TID) | ORAL | Status: DC | PRN

## 2014-05-17 MED ORDER — ALUM & MAG HYDROXIDE-SIMETH 200-200-20 MG/5ML PO SUSP: 30.0000 mL | ORAL | Status: DC | PRN

## 2014-05-17 MED ORDER — PROMETHAZINE HCL 25 MG/ML IJ SOLN: 6.2500 mg | INTRAMUSCULAR | Status: DC | PRN

## 2014-05-17 MED ORDER — PHENYLEPHRINE HCL 10 MG/ML IJ SOLN
INTRAMUSCULAR | Status: AC
  Filled 2014-05-17: qty 1

## 2014-05-17 SURGICAL SUPPLY — 40 items
BAG ZIPLOCK 12X15 (MISCELLANEOUS) IMPLANT
BENZOIN TINCTURE PRP APPL 2/3 (GAUZE/BANDAGES/DRESSINGS) ×3 IMPLANT
BLADE SAW SGTL 18X1.27X75 (BLADE) ×2 IMPLANT
BLADE SAW SGTL 18X1.27X75MM (BLADE) ×1
CAPT HIP PF COP ×3 IMPLANT
CELLS DAT CNTRL 66122 CELL SVR (MISCELLANEOUS) ×1 IMPLANT
CLOSURE WOUND 1/2 X4 (GAUZE/BANDAGES/DRESSINGS) ×1
COVER PERINEAL POST (MISCELLANEOUS) ×3 IMPLANT
DRAPE C-ARM 42X120 X-RAY (DRAPES) ×3 IMPLANT
DRAPE STERI IOBAN 125X83 (DRAPES) ×3 IMPLANT
DRAPE U-SHAPE 47X51 STRL (DRAPES) ×9 IMPLANT
DRSG AQUACEL AG ADV 3.5X10 (GAUZE/BANDAGES/DRESSINGS) ×3 IMPLANT
DURAPREP 26ML APPLICATOR (WOUND CARE) ×3 IMPLANT
ELECT BLADE TIP CTD 4 INCH (ELECTRODE) ×3 IMPLANT
ELECT REM PT RETURN 9FT ADLT (ELECTROSURGICAL) ×3
ELECTRODE REM PT RTRN 9FT ADLT (ELECTROSURGICAL) ×1 IMPLANT
FACESHIELD WRAPAROUND (MASK) ×12 IMPLANT
GAUZE XEROFORM 1X8 LF (GAUZE/BANDAGES/DRESSINGS) IMPLANT
GLOVE BIO SURGEON STRL SZ7.5 (GLOVE) ×3 IMPLANT
GLOVE BIOGEL PI IND STRL 8 (GLOVE) ×2 IMPLANT
GLOVE BIOGEL PI INDICATOR 8 (GLOVE) ×4
GLOVE ECLIPSE 8.0 STRL XLNG CF (GLOVE) ×3 IMPLANT
GOWN STRL REUS W/TWL XL LVL3 (GOWN DISPOSABLE) ×6 IMPLANT
HANDPIECE INTERPULSE COAX TIP (DISPOSABLE) ×2
KIT BASIN OR (CUSTOM PROCEDURE TRAY) ×3 IMPLANT
PACK TOTAL JOINT (CUSTOM PROCEDURE TRAY) ×3 IMPLANT
RTRCTR WOUND ALEXIS 18CM MED (MISCELLANEOUS) ×3
SET HNDPC FAN SPRY TIP SCT (DISPOSABLE) ×1 IMPLANT
STAPLER VISISTAT 35W (STAPLE) IMPLANT
STRIP CLOSURE SKIN 1/2X4 (GAUZE/BANDAGES/DRESSINGS) ×2 IMPLANT
SUT ETHIBOND NAB CT1 #1 30IN (SUTURE) ×3 IMPLANT
SUT ETHILON 3 0 PS 1 (SUTURE) IMPLANT
SUT MNCRL AB 4-0 PS2 18 (SUTURE) ×3 IMPLANT
SUT VIC AB 0 CT1 36 (SUTURE) ×3 IMPLANT
SUT VIC AB 1 CT1 36 (SUTURE) ×3 IMPLANT
SUT VIC AB 2-0 CT1 27 (SUTURE) ×4
SUT VIC AB 2-0 CT1 TAPERPNT 27 (SUTURE) ×2 IMPLANT
TOWEL OR 17X26 10 PK STRL BLUE (TOWEL DISPOSABLE) ×3 IMPLANT
TOWEL OR NON WOVEN STRL DISP B (DISPOSABLE) ×3 IMPLANT
TRAY FOLEY CATH 14FRSI W/METER (CATHETERS) ×3 IMPLANT

## 2014-05-17 NOTE — Plan of Care (Signed)
Problem: Consults Goal: Diagnosis- Total Joint Replacement Right anterior hip     

## 2014-05-17 NOTE — H&P (Signed)
TOTAL HIP ADMISSION H&P  Patient is admitted for right total hip arthroplasty.  Subjective:  Chief Complaint: right hip pain  HPI: Victoria RalphsSusan Guarisco, 68 y.o. female, has a history of pain and functional disability in the right hip(s) due to arthritis and patient has failed non-surgical conservative treatments for greater than 12 weeks to include NSAID's and/or analgesics, corticosteriod injections, use of assistive devices and activity modification.  Onset of symptoms was gradual starting 4 years ago with gradually worsening course since that time.The patient noted no past surgery on the right hip(s).  Patient currently rates pain in the right hip at 9 out of 10 with activity. Patient has night pain, worsening of pain with activity and weight bearing, pain that interfers with activities of daily living and pain with passive range of motion. Patient has evidence of subchondral sclerosis, periarticular osteophytes and joint space narrowing by imaging studies. This condition presents safety issues increasing the risk of falls.  There is no current active infection.  Patient Active Problem List   Diagnosis Date Noted  . Arthritis of right hip 05/17/2014  . Postoperative anemia due to acute blood loss 05/20/2013    Class: Acute  . Degenerative arthritis of hip 05/18/2013   Past Medical History  Diagnosis Date  . Heart palpitations     HX OF PALPITATIONS WITH STRESS  . GERD (gastroesophageal reflux disease)   . H/O hiatal hernia   . Headache(784.0)     MIGRAINES WITH AURA- JUST OCCAS  . Arthritis     PAIN AND OA BOTH HIPS - LEFT IS WORSE.  PT HAS ARTHRITIS IN HER BACK  . Hepatitis     AGE 62 - INFECTIOUS HEPATITIS - PROBABLY FROM FOOD--NO KNOWN RESIDUAL LIVER PROBLMS  . Cystocele 05-08-14    Past Surgical History  Procedure Laterality Date  . Tubal ligation    . Back surgery  1993    LAMINECTOMY  . Breast surgery      BREAST BIOPSY X 2 - BENIGN  . Appendectomy      1998  . Total hip  arthroplasty Left 05/18/2013    Procedure: LEFT TOTAL HIP ARTHROPLASTY ANTERIOR APPROACH;  Surgeon: Kathryne Hitchhristopher Y Melessa Cowell, MD;  Location: [facility] ORS;  Service: Orthopedics;  Laterality: Left;    Prescriptions prior to admission  Medication Sig Dispense Refill  . acetaminophen (TYLENOL) 500 MG tablet Take 500 mg by mouth every 6 (six) hours as needed for pain.      Marland Kitchen. conjugated estrogens (PREMARIN) vaginal cream Place 1 Applicatorful vaginally 2 (two) times a week. Monday and Thursday      . omeprazole (PRILOSEC) 10 MG capsule Take 10 mg by mouth daily.      . pantoprazole (PROTONIX) 40 MG tablet Take 40 mg by mouth daily before breakfast.       Allergies  Allergen Reactions  . Dilaudid [Hydromorphone Hcl]     Extreme drops blood pressure (most heavy narcotics)  . Elavil [Amitriptyline]     hyperactivity  . Levaquin [Levofloxacin] Itching and Swelling  . Penicillins Rash    History  Substance Use Topics  . Smoking status: Never Smoker   . Smokeless tobacco: Never Used  . Alcohol Use: No    History reviewed. No pertinent family history.   Review of Systems  Musculoskeletal: Positive for joint pain.  All other systems reviewed and are negative.   Objective:  Physical Exam  Constitutional: She is oriented to person, place, and time. She appears well-developed and well-nourished.  HENT:  Head: Normocephalic and atraumatic.  Eyes: EOM are normal. Pupils are equal, round, and reactive to light.  Neck: Normal range of motion. Neck supple.  Cardiovascular: Normal rate and regular rhythm.   Respiratory: Effort normal and breath sounds normal.  GI: Soft. Bowel sounds are normal.  Musculoskeletal:       Right hip: She exhibits decreased range of motion, decreased strength and bony tenderness.  Neurological: She is alert and oriented to person, place, and time.  Skin: Skin is warm and dry.  Psychiatric: She has a normal mood and affect.    Vital signs in last 24 hours: Temp:  [97.9  F (36.6 C)] 97.9 F (36.6 C) (06/05 0537) Pulse Rate:  [90] 90 (06/05 0537) Resp:  [18] 18 (06/05 0537) BP: (137)/(70) 137/70 mmHg (06/05 0537) SpO2:  [99 %] 99 % (06/05 0537)  Labs:   Estimated body mass index is 24.46 kg/(m^2) as calculated from the following:   Height as of 05/08/14: 5\' 5"  (1.651 m).   Weight as of 05/18/13: 66.679 kg (147 lb).   Imaging Review Plain radiographs demonstrate severe degenerative joint disease of the right hip(s). The bone quality appears to be good for age and reported activity level.  Assessment/Plan:  End stage arthritis, right hip(s)  The patient history, physical examination, clinical judgement of the provider and imaging studies are consistent with end stage degenerative joint disease of the right hip(s) and total hip arthroplasty is deemed medically necessary. The treatment options including medical management, injection therapy, arthroscopy and arthroplasty were discussed at length. The risks and benefits of total hip arthroplasty were presented and reviewed. The risks due to aseptic loosening, infection, stiffness, dislocation/subluxation,  thromboembolic complications and other imponderables were discussed.  The patient acknowledged the explanation, agreed to proceed with the plan and consent was signed. Patient is being admitted for inpatient treatment for surgery, pain control, PT, OT, prophylactic antibiotics, VTE prophylaxis, progressive ambulation and ADL's and discharge planning.The patient is planning to be discharged home with home health services

## 2014-05-17 NOTE — Progress Notes (Signed)
PT Cancellation Note  Patient Details Name: Jasleene Janson MRN: [id number] DOB: 1946/07/23   Cancelled Treatment:    Reason Eval/Treat Not Completed: Medical issues which prohibited therapy Pt attempted to get up with nsg earlier and had vasovagal episode per RN so will check back tomorrow for evaluation.   Lynnell Catalan 05/17/2014, 3:07 PM Zenovia Jarred, PT, DPT 05/17/2014 Pager: [phone]

## 2014-05-17 NOTE — Transfer of Care (Signed)
Immediate Anesthesia Transfer of Care Note  Patient: Victoria Ward  Procedure(s) Performed: Procedure(s): RIGHT TOTAL HIP ARTHROPLASTY ANTERIOR APPROACH (Right)  Patient Location: PACU  Anesthesia Type:Spinal  Level of Consciousness: sedated, patient cooperative and responds to stimulation  Airway & Oxygen Therapy: Patient Spontanous Breathing and Patient connected to face mask oxygen  Post-op Assessment: Report given to PACU RN and Post -op Vital signs reviewed and stable  Post vital signs: Reviewed and stable  Complications: No apparent anesthesia complications

## 2014-05-17 NOTE — Anesthesia Postprocedure Evaluation (Signed)
Anesthesia Post Note  Patient: Victoria Ward  Procedure(s) Performed: Procedure(s) (LRB): RIGHT TOTAL HIP ARTHROPLASTY ANTERIOR APPROACH (Right)  Anesthesia type: Spinal  Patient location: PACU  Post pain: Pain level controlled  Post assessment: Post-op Vital signs reviewed  Last Vitals: BP 120/76  Pulse 56  Temp(Src) 36.3 C (Oral)  Resp 16  Ht 5\' 5"  (1.651 m)  Wt 152 lb (68.947 kg)  BMI 25.29 kg/m2  SpO2 100%  Post vital signs: Reviewed  Level of consciousness: sedated  Complications: No apparent anesthesia complications

## 2014-05-17 NOTE — Anesthesia Procedure Notes (Addendum)
Spinal  Patient location during procedure: OR Start time: 05/17/2014 7:39 AM End time: 05/17/2014 7:41 AM Staffing Anesthesiologist: Nolon Nations R Performed by: anesthesiologist  Preanesthetic Checklist Completed: patient identified, site marked, surgical consent, pre-op evaluation, timeout performed, IV checked, risks and benefits discussed and monitors and equipment checked Spinal Block Patient position: sitting Prep: Betadine Patient monitoring: heart rate, continuous pulse ox and blood pressure Approach: left paramedian Location: L3-4 Injection technique: single-shot Needle Needle type: Sprotte  Needle gauge: 24 G Needle length: 9 cm Assessment Sensory level: T8 Additional Notes Expiration date of kit checked and confirmed. Patient tolerated procedure well, without complications.

## 2014-05-17 NOTE — Brief Op Note (Signed)
05/17/2014  8:53 AM  PATIENT:  Victoria Ward  68 y.o. female  PRE-OPERATIVE DIAGNOSIS:  Severe osteoarthritis right hip  POST-OPERATIVE DIAGNOSIS:  Severe osteoarthritis right hip  PROCEDURE:  Procedure(s): RIGHT TOTAL HIP ARTHROPLASTY ANTERIOR APPROACH (Right)  SURGEON:  Surgeon(s) and Role:    * Kathryne Hitch, MD - Primary  PHYSICIAN ASSISTANT: Rexene Edison, PA-C  ANESTHESIA:   spinal  EBL:  Total I/O In: 1000 [I.V.:1000] Out: 725 [Urine:225; Blood:500]  BLOOD ADMINISTERED:none  DRAINS: none   LOCAL MEDICATIONS USED:  NONE  SPECIMEN:  No Specimen  DISPOSITION OF SPECIMEN:  N/A  COUNTS:  YES  TOURNIQUET:  * No tourniquets in log *  DICTATION: .Other Dictation: Dictation Number [id number]  PLAN OF CARE: Admit to inpatient   PATIENT DISPOSITION:  PACU - hemodynamically stable.   Delay start of Pharmacological VTE agent (>24hrs) due to surgical blood loss or risk of bleeding: no

## 2014-05-17 NOTE — Progress Notes (Signed)
Utilization review completed.  

## 2014-05-17 NOTE — Anesthesia Preprocedure Evaluation (Addendum)
Anesthesia Evaluation  Patient identified by MRN, date of birth, ID band Patient awake    Reviewed: Allergy & Precautions, H&P , NPO status , Patient's Chart, lab work & pertinent test results  Airway Mallampati: II TM Distance: >3 FB Neck ROM: Full    Dental no notable dental hx.    Pulmonary neg pulmonary ROS,  breath sounds clear to auscultation  Pulmonary exam normal       Cardiovascular negative cardio ROS  Rhythm:Regular Rate:Normal     Neuro/Psych  Headaches, negative psych ROS   GI/Hepatic hiatal hernia, GERD-  Medicated,(+) Hepatitis -, Unspecified  Endo/Other  negative endocrine ROS  Renal/GU negative Renal ROS     Musculoskeletal negative musculoskeletal ROS (+)   Abdominal   Peds  Hematology  (+) anemia ,   Anesthesia Other Findings   Reproductive/Obstetrics negative OB ROS                           Anesthesia Physical  Anesthesia Plan  ASA: II  Anesthesia Plan: Spinal   Post-op Pain Management:    Induction: Intravenous  Airway Management Planned: Simple Face Mask  Additional Equipment:   Intra-op Plan:   Post-operative Plan:   Informed Consent: I have reviewed the patients History and Physical, chart, labs and discussed the procedure including the risks, benefits and alternatives for the proposed anesthesia with the patient or authorized representative who has indicated his/her understanding and acceptance.   Dental advisory given  Plan Discussed with: CRNA  Anesthesia Plan Comments:         Anesthesia Quick Evaluation

## 2014-05-18 LAB — CBC
HCT: 32.7 % — ABNORMAL LOW (ref 36.0–46.0)
HEMOGLOBIN: 10.9 g/dL — AB (ref 12.0–15.0)
MCH: 30.2 pg (ref 26.0–34.0)
MCHC: 33.3 g/dL (ref 30.0–36.0)
MCV: 90.6 fL (ref 78.0–100.0)
Platelets: 149 10*3/uL — ABNORMAL LOW (ref 150–400)
RBC: 3.61 MIL/uL — AB (ref 3.87–5.11)
RDW: 13.2 % (ref 11.5–15.5)
WBC: 6.5 10*3/uL (ref 4.0–10.5)

## 2014-05-18 LAB — BASIC METABOLIC PANEL
BUN: 7 mg/dL (ref 6–23)
CHLORIDE: 107 meq/L (ref 96–112)
CO2: 27 meq/L (ref 19–32)
Calcium: 8.3 mg/dL — ABNORMAL LOW (ref 8.4–10.5)
Creatinine, Ser: 0.68 mg/dL (ref 0.50–1.10)
GFR calc Af Amer: >90 mL/min (ref >90)
GFR calc non Af Amer: 88 mL/min — ABNORMAL LOW (ref >90)
GLUCOSE: 126 mg/dL — AB (ref 70–99)
Potassium: 4.2 mEq/L (ref 3.7–5.3)
SODIUM: 141 meq/L (ref 137–147)

## 2014-05-18 MED ORDER — ASPIRIN 325 MG PO TBEC: 325.0000 mg | DELAYED_RELEASE_TABLET | Freq: Two times a day (BID) | ORAL | Status: DC

## 2014-05-18 MED ORDER — METHOCARBAMOL 500 MG PO TABS: 500.0000 mg | ORAL_TABLET | Freq: Four times a day (QID) | ORAL | Status: DC | PRN

## 2014-05-18 NOTE — Progress Notes (Signed)
05/18/14 1400  PT Visit Information  Last PT Received On 05/18/14  Assistance Needed +1  History of Present Illness 68 yo female adm 05/17/14 for DA RIGHT  THA  PT Time Calculation  PT Start Time 1409  PT Stop Time 1436  PT Time Calculation (min) 27 min  Subjective Data  Subjective I got some sleep  Patient Stated Goal walk normally again without pain  Precautions  Precautions None  Restrictions  Other Position/Activity Restrictions WBAT  Cognition  Arousal/Alertness Awake/alert  Behavior During Therapy WFL for tasks assessed/performed  Overall Cognitive Status Within Functional Limits for tasks assessed  Bed Mobility  Overal bed mobility Needs Assistance  Bed Mobility Supine to Sit;Sit to Supine  Supine to sit Min assist  Sit to supine Min assist  General bed mobility comments assist with RLE, incr time and cues for technique  Transfers  Overall transfer level Needs assistance  Equipment used Rolling walker (2 wheeled)  Transfers Sit to/from Stand  Sit to Stand Min guard  General transfer comment cues for hand placement and wt shift  Ambulation/Gait  Ambulation/Gait assistance Min guard  Ambulation Distance (Feet) 140 Feet  Assistive device Rolling walker (2 wheeled)  Gait Pattern/deviations Step-to pattern;Step-through pattern;Decreased stance time - right  Gait velocity decr  General Gait Details cues for sequence and RW position  Total Joint Exercises  Ankle Circles/Pumps AROM;Both;10 reps  Quad Sets AROM;Both;10 reps  Short Arc Quad AROM;Strengthening;Right;10 reps  Heel Slides AAROM;AROM;Right;10 reps  PT - End of Session  Activity Tolerance Patient tolerated treatment well  Patient left in bed;with call bell/phone within reach;with family/visitor present  Nurse Communication Mobility status  PT - Assessment/Plan  PT Plan Current plan remains appropriate  PT Frequency 7X/week  Follow Up Recommendations Home health PT  PT equipment None recommended by PT  PT  Goal Progression  Progress towards PT goals Progressing toward goals  Acute Rehab PT Goals  PT Goal Formulation With patient  Time For Goal Achievement 05/22/14  Potential to Achieve Goals Good  PT General Charges  $$ ACUTE PT VISIT 1 Procedure  PT Treatments  $Gait Training 8-22 mins  $Therapeutic Exercise 8-22 mins

## 2014-05-18 NOTE — Progress Notes (Signed)
Subjective: 1 Day Post-Op Procedure(s) (LRB): RIGHT TOTAL HIP ARTHROPLASTY ANTERIOR APPROACH (Right) Patient reports pain as moderate.    Objective: Vital signs in last 24 hours: Temp:  [94.3 F (34.6 C)-99.2 F (37.3 C)] 99.1 F (37.3 C) (06/06 0552) Pulse Rate:  [50-83] 83 (06/06 0552) Resp:  [7-20] 16 (06/06 0552) BP: (102-130)/(49-96) 102/59 mmHg (06/06 0552) SpO2:  [98 %-100 %] 98 % (06/06 0552) Weight:  [68.947 kg (152 lb)] 68.947 kg (152 lb) (06/05 1140)  Intake/Output from previous day: 06/05 0701 - 06/06 0700 In: 3997.5 [P.O.:460; I.V.:2977.5; IV Piggyback:160] Out: 4525 [Urine:4025; Blood:500] Intake/Output this shift: Total I/O In: 653.8 [P.O.:240; I.V.:413.8] Out: 425 [Urine:425]   Recent Labs  05/18/14 0535  HGB 10.9*    Recent Labs  05/18/14 0535  WBC 6.5  RBC 3.61*  HCT 32.7*  PLT 149*    Recent Labs  05/18/14 0535  NA 141  K 4.2  CL 107  CO2 27  BUN 7  CREATININE 0.68  GLUCOSE 126*  CALCIUM 8.3*   No results found for this basename: LABPT, INR,  in the last 72 hours  Sensation intact distally Intact pulses distally Dorsiflexion/Plantar flexion intact Incision: dressing C/D/I  Assessment/Plan: 1 Day Post-Op Procedure(s) (LRB): RIGHT TOTAL HIP ARTHROPLASTY ANTERIOR APPROACH (Right) Up with therapy Possibly d/c to home tomorrow with HHPT  Kathryne Hitch 05/18/2014, 9:46 AM

## 2014-05-18 NOTE — Progress Notes (Signed)
OT Cancellation Note  Patient Details Name: Victoria Ward MRN: [id number] DOB: [birth year]   Cancelled Treatment:    Reason Eval/Treat Not Completed: PT screened, no needs identified, will sign off.  Pt has had previous hip sx and has all DME and assistance as needed  [facility] 05/18/2014, 12:58 PM Marica Otter, OTR/L [phone] 05/18/2014

## 2014-05-18 NOTE — Op Note (Signed)
Victoria Ward, GOLIK NO.:  [id number]  MEDICAL RECORD NO.:  [id number]  LOCATION:  1614                         FACILITY:  [facility]  PHYSICIAN:  Vanita Panda. Magnus Ivan, M.D.DATE OF BIRTH:  17-Dec-1945  DATE OF PROCEDURE:  05/17/2014 DATE OF DISCHARGE:                              OPERATIVE REPORT   PREOPERATIVE DIAGNOSIS:  Severe end-stage arthritis and degenerative joint disease, right hip.  POSTOPERATIVE DIAGNOSIS:  Severe end-stage arthritis and degenerative joint disease, right hip.  PROCEDURE:  Right total hip arthroplasty through direct anterior approach.  IMPLANTS:  DePuy Sector Gription acetabular component size 50 with apex hole eliminator guide and a single screw, size 32+ 4 neutral polyethylene liner for size 50 acetabular component, size 12 Corail femoral component with standard offset, size 32+ 5 ceramic hip ball.  SURGEON:  Vanita Panda. Magnus Ivan, M.D.  ASSISTANT:  Richardean Canal, PA-C.  ANESTHESIA:  Spinal.  ANTIBIOTICS:  900 mg of IV clindamycin.  BLOOD LOSS:  500 mL.  COMPLICATIONS:  None.  INDICATIONS:  Ms. Trow is a 68 year old female with bilateral hip severe osteoarthritis.  She underwent a successful left total hip arthroplasty last year and now presents to have a right hip replaced. It has gotten to where her mobility is limited, her pain is daily, her activities of daily living, particularly in the hip due to her pain. She understands the goals of surgery, decreased pain, improved mobility, and improved quality of life.  She understands the risks of acute blood loss anemia, nerve and vessel injury, infection, fracture, dislocation and DVT.  She does provide informed consent for surgery.  PROCEDURE DESCRIPTION:  After informed consent was obtained, appropriate right hip was marked.  She was brought to the operating room and spinal anesthesia was obtained.  She was then laid back supine on her stretcher.  Foley catheter was  placed and both feet had traction boots applied to them.  Next, she was placed supine on the Hana fracture table with the perineal post in place and both legs in inline skeletal traction devices, but no traction applied.  Her right operative hip was then prepped and draped with DuraPrep and sterile drapes.  A time-out was called to identify the correct patient and correct right hip.  We then made an incision inferior and posterior to the anterior-superior iliac spine and carried this obliquely down the leg.  I dissected down to the tensor fascia lata muscle and the tensor fascia was then divided longitudinally, so we could proceed with a direct anterior approach to the hip.  We cauterized the lateral femoral circumflex vessels and then I placed a Cobra retractor around the lateral neck and up underneath the rectus femoris, a Cobra retractor medially.  We opened up the hip capsule in L-type format and found the large effusion.  We placed the Cobra retractors within the hip capsule.  We then made a femoral neck cut just proximal to the lesser trochanter using an oscillating saw and completed this with an osteotome.  We placed a corkscrew guide in the femoral head and removed the femoral head in its entirety and found it to be devoid of cartilage.  We then cleaned the acetabulum  debris including the remnants of acetabular labrum.  I placed a bent Hohmann medially and a Cobra retractor laterally.  I then began reaming under direct visualization from a size 42 reamer in 2 mm increments up to size 50 with last reamer also placed under direct visualization and fluoroscopy to obtain our depth of reaming, our inclination, and anteversion.  Once I was pleased with this, I placed the real DePuy Sector Gription acetabular component size 50, an apex hole eliminator guide, a single screw and then a 32+ 4 neutral polyethylene liner for a size 50 cup.  We then externally rotated the femur to 100  degrees, extended and adducted it.  We placed a Mueller retractor medially and a Hohmann retractor behind the greater trochanter.  We released the lateral joint capsule and then used a box cutting osteotome to enter the femoral canal and a rongeur to lateralize.  I then began broaching from a size 8 broach up to a size 12.  With the size 12 broach in place, we used a calcar planer to smooth out the calcar.  Then, we placed a standard neck and a 32+ 1 hip ball.  We brought the leg back over and up with traction and internal rotation, reduced in the pelvis and it was stable on all planes of rotation.  I was pleased with the stability with the leg lengths, so she was just a little short.  So, we dislocated the hip and removed the trial components and placed the real 12 Corail femoral component and went down nicely on the calcar.  I placed a 32+ 5 ceramic hip ball.  We brought the leg back over and up with traction and internal rotation, again reduced in the pelvis.  I was pleased with her offset and her leg lengths under direct fluoroscopy, and rotation and stability.  We then irrigated the hip with normal saline solution using pulsatile lavage.  We closed the hip capsule with interrupted #1 Ethibond suture followed by running #1 Vicryl in the tensor fascia, 0 Vicryl in the deep tissue, 2-0 Vicryl in the subcutaneous tissue, 4-0 Monocryl subcuticular stitch and Steri-Strips on the skin.  An Aquacel dressing was applied.  She was taken off of the Hana table and taken to the recovery room in stable condition.  All final counts were correct. There were no complications noted.  Of note, Richardean CanalGilbert Clark, PA-C assisted during the entire case and his assistance was crucial for patient positioning and retracting, two-layered closure of the wound.     Vanita Pandahristopher Y. Magnus IvanBlackman, M.D.     CYB/MEDQ  D:  05/17/2014  T:  05/18/2014  Job:  [id number]

## 2014-05-18 NOTE — Progress Notes (Signed)
CARE MANAGEMENT NOTE 05/18/2014  Patient:  Victoria Ward, Victoria Ward   Account Number:  [id number]  Date Initiated:  05/18/2014  Documentation initiated by:  [facility]  Subjective/Objective Assessment:   RIGHT TOTAL HIP ARTHROPLASTY ANTERIOR APPROACH     Action/Plan:   HH   Anticipated DC Date:  05/20/2014   Anticipated DC Plan:  HOME W HOME HEALTH SERVICES      DC Planning Services  CM consult      [facility] Choice  HOME HEALTH   Choice offered to / List presented to:  C-1 Patient        HH arranged  HH-2 PT      [facility] agency  [facility]   Status of service:  Completed, signed off Medicare Important Message given?   (If response is "NO", the following Medicare IM given date fields will be blank) Date Medicare IM given:   Date Additional Medicare IM given:    Discharge Disposition:  HOME W HOME HEALTH SERVICES  Per UR Regulation:    If discussed at Long Length of Stay Meetings, dates discussed:    Comments:  05/18/2014 1245 NCM spoke to pt and offered choice for [facility]. Pt agreeable to [facility] for [facility]. States she has RW, cane and bedside commode at home. Isidoro Donning RN CCM Case Mgmt phone [phone]

## 2014-05-18 NOTE — Evaluation (Signed)
Physical Therapy Evaluation Patient Details Name: Victoria RalphsSusan Roupp MRN: [id number] DOB: 1946/01/01 Today's Date: 05/18/2014   History of Present Illness  68 yo female adm 05/17/14 for DA RIGHT  THA  Clinical Impression  Pt will benefit from PT to address deficits below; should progress very well; plan is for HHPT, no DME needs    Follow Up Recommendations Home health PT    Equipment Recommendations  None recommended by PT    Recommendations for Other Services       Precautions / Restrictions Precautions Precautions: None Restrictions Other Position/Activity Restrictions: WBAT      Mobility  Bed Mobility Overal bed mobility: Needs Assistance Bed Mobility: Supine to Sit     Supine to sit: Min assist     General bed mobility comments: assist with RLE, incr time and cues for technique  Transfers Overall transfer level: Needs assistance Equipment used: Rolling walker (2 wheeled) Transfers: Sit to/from Stand Sit to Stand: Min assist         General transfer comment: cues for hand placement and wt shift  Ambulation/Gait Ambulation/Gait assistance: Min assist Ambulation Distance (Feet): 100 Feet Assistive device: Rolling walker (2 wheeled) Gait Pattern/deviations: Step-to pattern;Decreased stance time - right;Antalgic;Decreased step length - right;Decreased step length - left Gait velocity: decr   General Gait Details: cues for sequence and RW position  Stairs            Wheelchair Mobility    Modified Rankin (Stroke Patients Only)       Balance                                             Pertinent Vitals/Pain Pain controlled, ice to right hip after PT    Home Living Family/patient expects to be discharged to:: Private residence Living Arrangements: Spouse/significant other   Type of Home: House Home Access: Stairs to enter Entrance Stairs-Rails: Doctor, general practiceight;Left Entrance Stairs-Number of Steps: 4 Home Layout: One level Home  Equipment: Bedside commode;Walker - 2 wheels;Shower seat - built in      Prior Function Level of Independence: Independent               Higher education careers adviserHand Dominance        Extremity/Trunk Assessment   Upper Extremity Assessment: Defer to OT evaluation           Lower Extremity Assessment: RLE deficits/detail RLE Deficits / Details: moves ankle and knee grossly WFL with incr time to flex knee       Communication   Communication: No difficulties  Cognition Arousal/Alertness: Awake/alert Behavior During Therapy: WFL for tasks assessed/performed Overall Cognitive Status: Within Functional Limits for tasks assessed                      General Comments      Exercises Total Joint Exercises Ankle Circles/Pumps: AROM;Both;10 reps      Assessment/Plan    PT Assessment Patient needs continued PT services  PT Diagnosis Difficulty walking   PT Problem List Decreased strength;Decreased activity tolerance;Decreased balance;Decreased mobility;Decreased knowledge of use of DME  PT Treatment Interventions DME instruction;Gait training;Stair training;Functional mobility training;Therapeutic activities;Therapeutic exercise;Patient/family education   PT Goals (Current goals can be found in the Care Plan section) Acute Rehab PT Goals Patient Stated Goal: walk normally again without pain PT Goal Formulation: With patient Time For Goal Achievement: 05/22/14 Potential to Achieve  Goals: Good    Frequency 7X/week   Barriers to discharge        Co-evaluation               End of Session Equipment Utilized During Treatment: Gait belt Activity Tolerance: Patient tolerated treatment well Patient left: in chair;with call bell/phone within reach;with family/visitor present Nurse Communication: Mobility status         Time: 5537-4827 PT Time Calculation (min): 28 min   Charges:   PT Evaluation $Initial PT Evaluation Tier I: 1 Procedure PT Treatments $Gait Training:  23-37 mins   PT G Codes:          Caswell Corwin 05/18/2014, 1:42 PM

## 2014-05-19 LAB — CBC
HCT: 31.4 % — ABNORMAL LOW (ref 36.0–46.0)
Hemoglobin: 10.6 g/dL — ABNORMAL LOW (ref 12.0–15.0)
MCH: 30.8 pg (ref 26.0–34.0)
MCHC: 33.8 g/dL (ref 30.0–36.0)
MCV: 91.3 fL (ref 78.0–100.0)
PLATELETS: 150 10*3/uL (ref 150–400)
RBC: 3.44 MIL/uL — AB (ref 3.87–5.11)
RDW: 13.5 % (ref 11.5–15.5)
WBC: 7.3 10*3/uL (ref 4.0–10.5)

## 2014-05-19 NOTE — Plan of Care (Signed)
Problem: Discharge Progression Outcomes Goal: Anticoagulant follow-up in place Outcome: Not Applicable Date Met:  58/68/25 asa

## 2014-05-19 NOTE — Progress Notes (Signed)
Discharged from floor via w/c, spouse with pt. No changes in assessment. [employer]

## 2014-05-19 NOTE — Progress Notes (Signed)
Subjective: 2 Days Post-Op Procedure(s) (LRB): RIGHT TOTAL HIP ARTHROPLASTY ANTERIOR APPROACH (Right) Patient reports pain as mild.    Objective: Vital signs in last 24 hours: Temp:  [98.4 F (36.9 C)-100 F (37.8 C)] 98.4 F (36.9 C) (06/07 0500) Pulse Rate:  [79-83] 79 (06/07 0500) Resp:  [16] 16 (06/07 0500) BP: (105-115)/(57-64) 110/64 mmHg (06/07 0500) SpO2:  [97 %-99 %] 97 % (06/07 0500)  Intake/Output from previous day: 06/06 0701 - 06/07 0700 In: 1658.8 [P.O.:840; I.V.:818.8] Out: 2675 [Urine:2675] Intake/Output this shift:     Recent Labs  05/18/14 0535 05/19/14 0450  HGB 10.9* 10.6*    Recent Labs  05/18/14 0535 05/19/14 0450  WBC 6.5 7.3  RBC 3.61* 3.44*  HCT 32.7* 31.4*  PLT 149* 150    Recent Labs  05/18/14 0535  NA 141  K 4.2  CL 107  CO2 27  BUN 7  CREATININE 0.68  GLUCOSE 126*  CALCIUM 8.3*   No results found for this basename: LABPT, INR,  in the last 72 hours  Neurologically intact  Assessment/Plan: 2 Days Post-Op Procedure(s) (LRB): RIGHT TOTAL HIP ARTHROPLASTY ANTERIOR APPROACH (Right) Plan:  Discharge Home. Office 1 to 2 weeks  Eldred Manges 05/19/2014, 8:56 AM

## 2014-05-19 NOTE — Progress Notes (Signed)
Physical Therapy Treatment Patient Details Name: Victoria Ward MRN: [id number] DOB: 1946-10-18 Today's Date: May 29, 2014    History of Present Illness 68 yo female adm 05/17/14 for DA RIGHT  THA    PT Comments    Pt doing well; excellent progress  Follow Up Recommendations  Home health PT     Equipment Recommendations  None recommended by PT    Recommendations for Other Services       Precautions / Restrictions Precautions Precautions: None Restrictions Weight Bearing Restrictions: No Other Position/Activity Restrictions: WBAT    Mobility  Bed Mobility Overal bed mobility: Needs Assistance Bed Mobility: Supine to Sit     Supine to sit: Supervision     General bed mobility comments: incr time  Transfers Overall transfer level: Needs assistance Equipment used: Rolling walker (2 wheeled) Transfers: Sit to/from Stand Sit to Stand: Supervision         General transfer comment: cues for hand placement and wt shift  Ambulation/Gait Ambulation/Gait assistance: Supervision;Modified independent (Device/Increase time) Ambulation Distance (Feet): 220 Feet Assistive device: Rolling walker (2 wheeled) Gait Pattern/deviations: Step-through pattern;Trunk flexed     General Gait Details: cues for sequence and RW position, posture, heel to toe   Stairs Stairs: Yes Stairs assistance: Supervision Stair Management: Two rails Number of Stairs: 4 General stair comments: initial cue for sequence  Wheelchair Mobility    Modified Rankin (Stroke Patients Only)       Balance                                    Cognition Arousal/Alertness: Awake/alert Behavior During Therapy: WFL for tasks assessed/performed Overall Cognitive Status: Within Functional Limits for tasks assessed                      Exercises Total Joint Exercises Hip ABduction/ADduction: AROM;Strengthening;Both;10 reps;Standing Knee Flexion: Strengthening;Both;10  reps;Standing Marching in Standing: Strengthening;Both;10 reps;Standing Standing Hip Extension: Strengthening;Both;10 reps;Standing    General Comments        Pertinent Vitals/Pain "sore" right hip     Home Living                      Prior Function            PT Goals (current goals can now be found in the care plan section) Acute Rehab PT Goals Patient Stated Goal: walk normally again without pain Time For Goal Achievement: 05/22/14 Potential to Achieve Goals: Good Progress towards PT goals: Progressing toward goals    Frequency  7X/week    PT Plan Current plan remains appropriate    Co-evaluation             End of Session   Activity Tolerance: Patient tolerated treatment well Patient left: with call bell/phone within reach;Other (comment);with family/visitor present (EOB)     Time: 5859-2924 PT Time Calculation (min): 27 min  Charges:  $Gait Training: 8-22 mins $Therapeutic Exercise: 8-22 mins                    G Codes:      Caswell Corwin 2014-05-29, 9:36 AM

## 2014-05-20 NOTE — Discharge Summary (Signed)
Patient ID: Victoria Ward MRN: [id number] DOB/AGE: Mar 24, 1946 68 y.o.  Admit date: 05/17/2014 Discharge date: 05/20/2014  Admission Diagnoses:  Principal Problem:   Arthritis of right hip Active Problems:   Status post THR (total hip replacement)   Discharge Diagnoses:  Same  Past Medical History  Diagnosis Date  . Heart palpitations     HX OF PALPITATIONS WITH STRESS  . GERD (gastroesophageal reflux disease)   . H/O hiatal hernia   . Headache(784.0)     MIGRAINES WITH AURA- JUST OCCAS  . Arthritis     PAIN AND OA BOTH HIPS - LEFT IS WORSE.  PT HAS ARTHRITIS IN HER BACK  . Hepatitis     AGE 51 - INFECTIOUS HEPATITIS - PROBABLY FROM FOOD--NO KNOWN RESIDUAL LIVER PROBLMS  . Cystocele 05-08-14    Surgeries: Procedure(s): RIGHT TOTAL HIP ARTHROPLASTY ANTERIOR APPROACH on 05/17/2014   Consultants:    Discharged Condition: Improved  Hospital Course: Victoria Ward is an 68 y.o. female who was admitted 05/17/2014 for operative treatment ofArthritis of right hip. Patient has severe unremitting pain that affects sleep, daily activities, and work/hobbies. After pre-op clearance the patient was taken to the operating room on 05/17/2014 and underwent  Procedure(s): RIGHT TOTAL HIP ARTHROPLASTY ANTERIOR APPROACH.    Patient was given perioperative antibiotics: Anti-infectives   Start     Dose/Rate Route Frequency Ordered Stop   05/17/14 1400  clindamycin (CLEOCIN) IVPB 600 mg     600 mg 100 mL/hr over 30 Minutes Intravenous Every 6 hours 05/17/14 1145 05/17/14 2043   05/17/14 0551  clindamycin (CLEOCIN) IVPB 900 mg     900 mg 100 mL/hr over 30 Minutes Intravenous On call to O.R. 05/17/14 [id number] 05/17/14 [id number]       Patient was given sequential compression devices, early ambulation, and chemoprophylaxis to prevent DVT.  Patient benefited maximally from hospital stay and there were no complications.    Recent vital signs: No data found.    Recent laboratory studies:  Recent Labs  05/18/14 0535 05/19/14 0450  WBC 6.5 7.3  HGB 10.9* 10.6*  HCT 32.7* 31.4*  PLT 149* 150  NA 141  --   K 4.2  --   CL 107  --   CO2 27  --   BUN 7  --   CREATININE 0.68  --   GLUCOSE 126*  --   CALCIUM 8.3*  --      Discharge Medications:     Medication List         acetaminophen 500 MG tablet  Commonly known as:  TYLENOL  Take 500 mg by mouth every 6 (six) hours as needed for pain.     aspirin 325 MG EC tablet  Take 1 tablet (325 mg total) by mouth 2 (two) times daily after a meal.     conjugated estrogens vaginal cream  Commonly known as:  PREMARIN  Place 1 Applicatorful vaginally 2 (two) times a week. Monday and Thursday     methocarbamol 500 MG tablet  Commonly known as:  ROBAXIN  Take 1 tablet (500 mg total) by mouth every 6 (six) hours as needed for muscle spasms.     omeprazole 10 MG capsule  Commonly known as:  PRILOSEC  Take 10 mg by mouth daily.     pantoprazole 40 MG tablet  Commonly known as:  PROTONIX  Take 40 mg by mouth daily before breakfast.        Diagnostic Studies: Dg Hip Complete Right  05/17/2014  CLINICAL DATA:  right hip  EXAM: RIGHT HIP - COMPLETE 2+ VIEW  COMPARISON:  Right hip 12/29/2012  FINDINGS: Intra op right hip arthroplasty study. Hardware and native osseous structures grossly intact. Left hip arthroplasty partially visualized.  IMPRESSION: Intra op right hip arthroplasty evaluation.   Electronically Signed   By: Salome Holmes M.D.   On: 05/17/2014 09:21   Dg Pelvis Portable  05/17/2014   CLINICAL DATA:  Postop right hip.  EXAM: PORTABLE PELVIS 1-2 VIEWS  COMPARISON:  05/18/2013  FINDINGS: Changes of right hip replacement. No hardware or bony complicating feature. Remote left hip replacement changes.  IMPRESSION: Interval right hip replacement without bony or hardware complicating feature.   Electronically Signed   By: Charlett Nose M.D.   On: 05/17/2014 10:44   Dg Hip Portable 1 View Right  05/17/2014   CLINICAL DATA:  Postop  right hip.  EXAM: PORTABLE RIGHT HIP - 1 VIEW  COMPARISON:  Intraoperative images and AP view of the pelvis performed earlier today.  FINDINGS: Cross-table lateral view demonstrates changes of right hip replacement. Normal alignment. No hardware or bony complicating feature visualized.  IMPRESSION: Right hip replacement without visible complicating feature.   Electronically Signed   By: Charlett Nose M.D.   On: 05/17/2014 10:29   Dg C-arm 1-60 Min-no Report  05/17/2014   CLINICAL DATA: intraop   C-ARM 1-60 MINUTES  Fluoroscopy was utilized by the requesting physician.  No radiographic  interpretation.     Disposition: 01-Home or Self Care      Discharge Instructions   Call MD / Call 911    Complete by:  As directed   If you experience chest pain or shortness of breath, CALL 911 and be transported to the hospital emergency room.  If you develope a fever above 101 F, pus (white drainage) or increased drainage or redness at the wound, or calf pain, call your surgeon's office.     Constipation Prevention    Complete by:  As directed   Drink plenty of fluids.  Prune juice may be helpful.  You may use a stool softener, such as Colace (over the counter) 100 mg twice a day.  Use MiraLax (over the counter) for constipation as needed.     Diet - low sodium heart healthy    Complete by:  As directed      Discharge instructions    Complete by:  As directed   Increase your activities as comfort allows. You can get your actual dressing wet in the shower. You can remove your dressing in 6 days and then start getting your actual incision wet. Leave the steristrips in place; then new dry dressing daily starting in 6 days.     Increase activity slowly as tolerated    Complete by:  As directed            Follow-up Information   Follow up with Kathryne Hitch, MD In 2 weeks.   Specialty:  Orthopedic Surgery   Contact information:   [address] [place] [address] [phone]        Follow up with [facility])    Contact information:   [address] [place] [address] [phone]        Signed: Kathryne Hitch 05/20/2014, 6:53 AM

## 2014-07-05 IMAGING — CR DG HIP 1V PORT*L*
1 series · 1 of 1 positions shown · non-contrast
Comparison: 12/29/2012.

CLINICAL DATA: Postop left..

PORTABLE LEFT HIP - 1 VIEW,PORTABLE PELVIS

[AP]
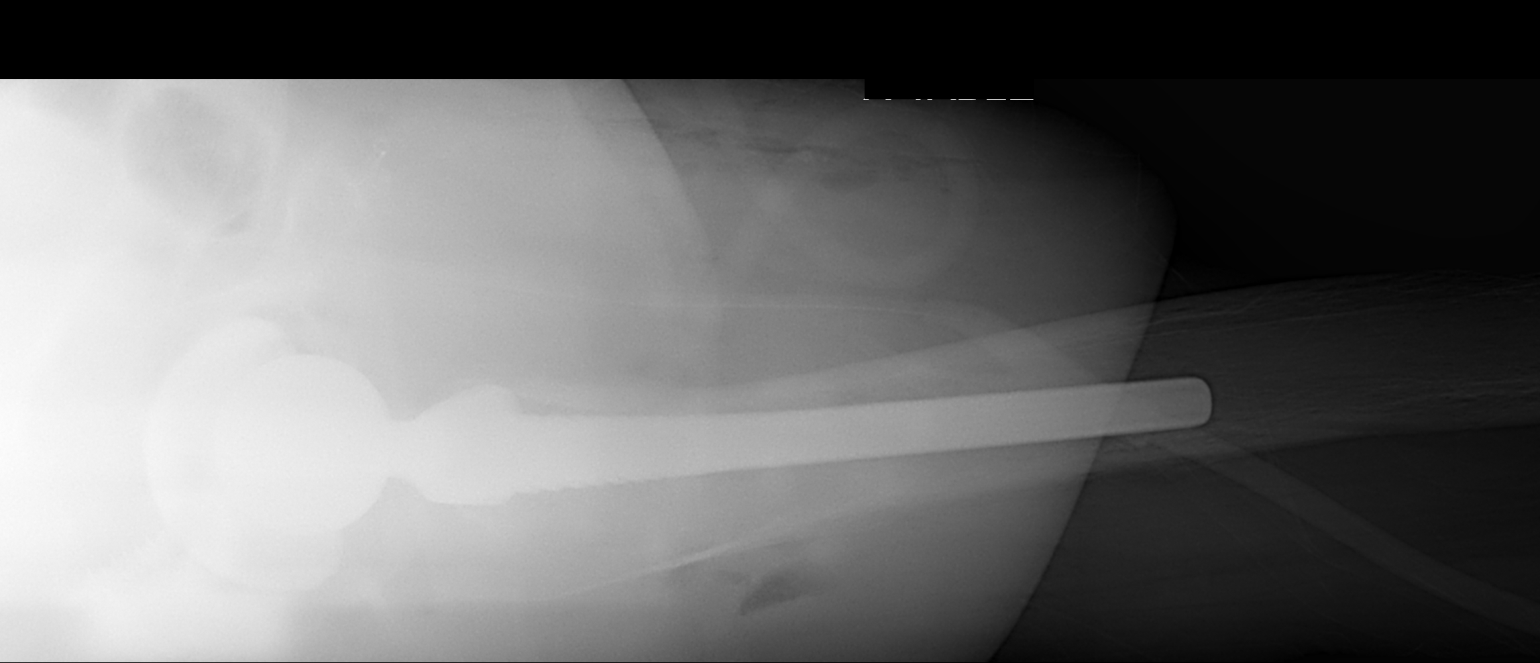

[1 of 1 positions shown; findings below may reference images not displayed]

FINDINGS: Post total left hip replacement which appears in
satisfactory position without complication noted.  Surgical drain
is in place.

Prominent right hip joint degenerative changes.

Degenerative changes L5-S1.
IMPRESSION: Post total left hip replacement which appears in satisfactory
position without complication noted.

## 2014-07-05 IMAGING — RF DG HIP (WITH OR WITHOUT PELVIS) 2-3V*L*
1 series · 4 of 4 positions shown · non-contrast
Comparison: 06/10/2011

CLINICAL DATA: Left total hip arthroplasty.

LEFT HIP - COMPLETE 2+ VIEW

[Series 1: run · 4 of 4 slices shown]
[im 1/4]
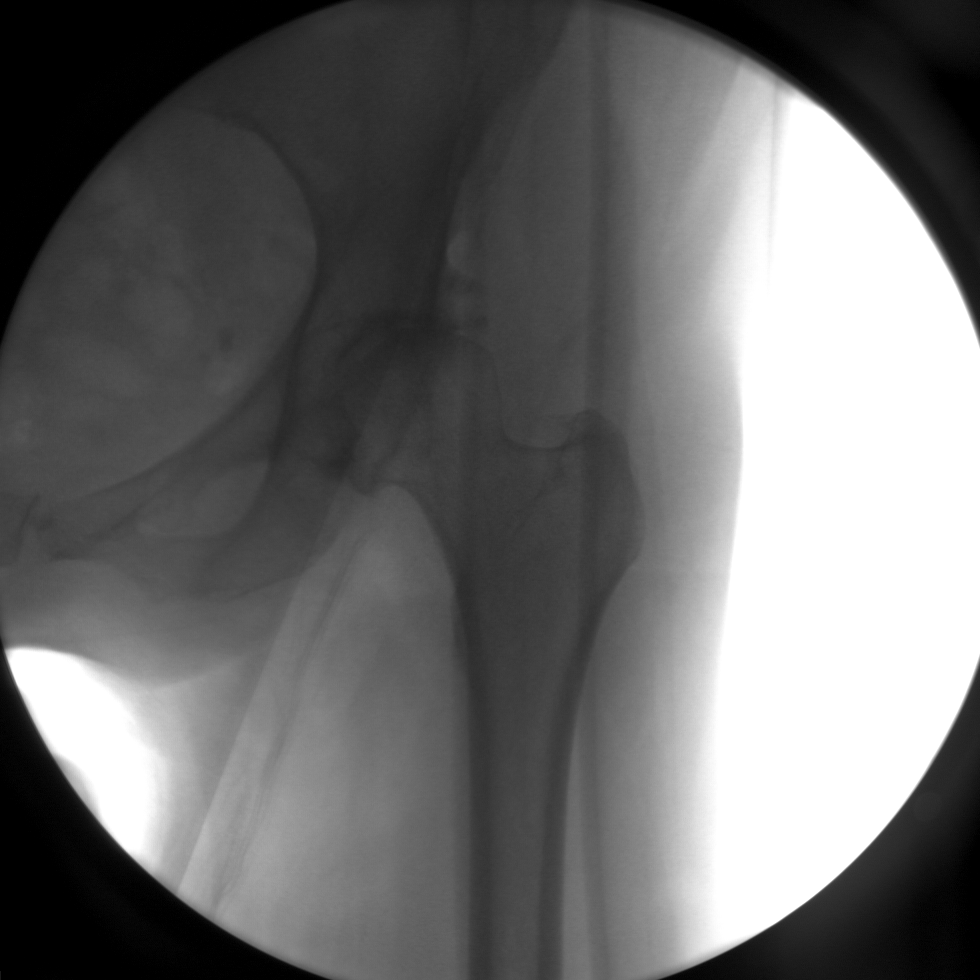
[im 2/4]
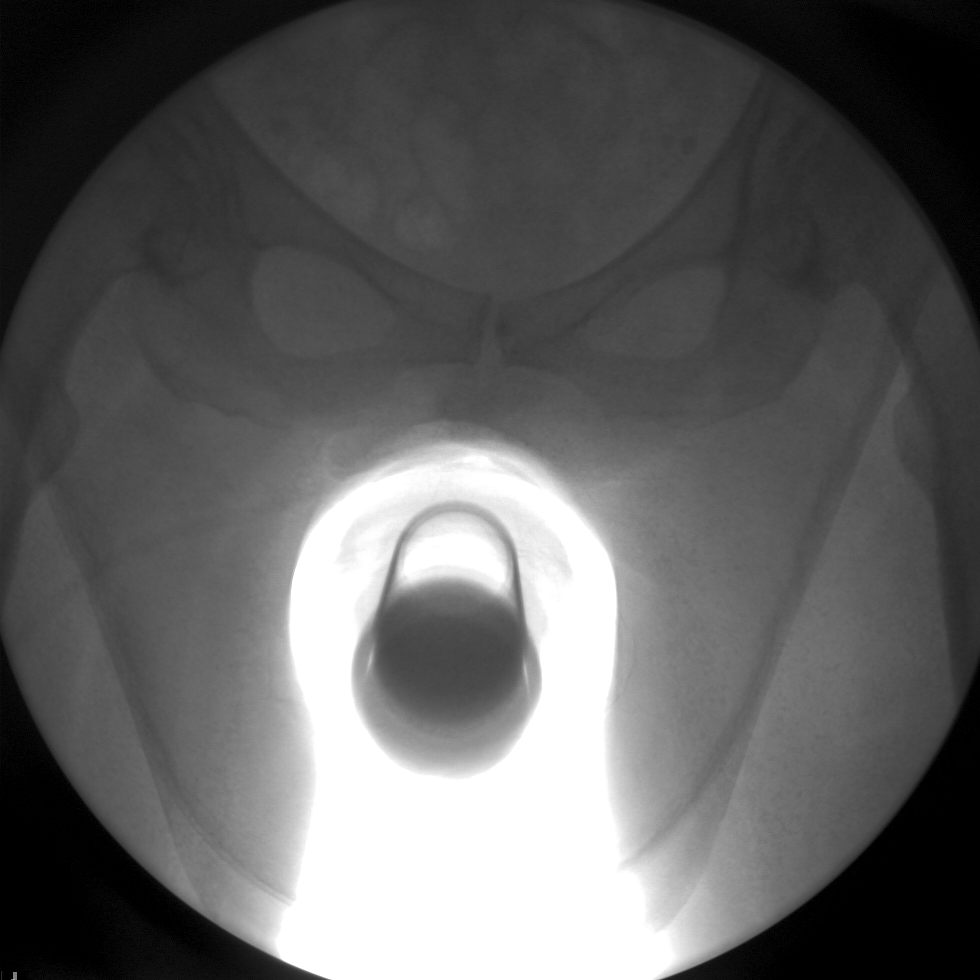
[im 3/4]
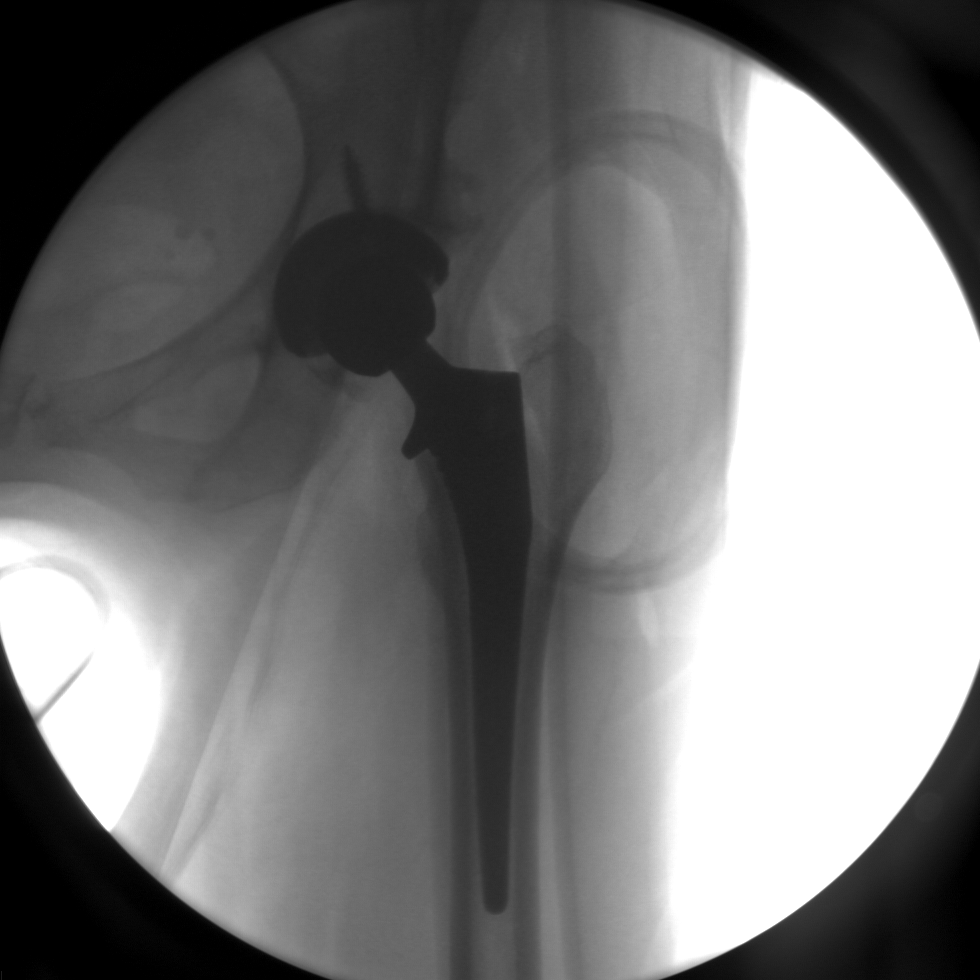
[im 4/4]
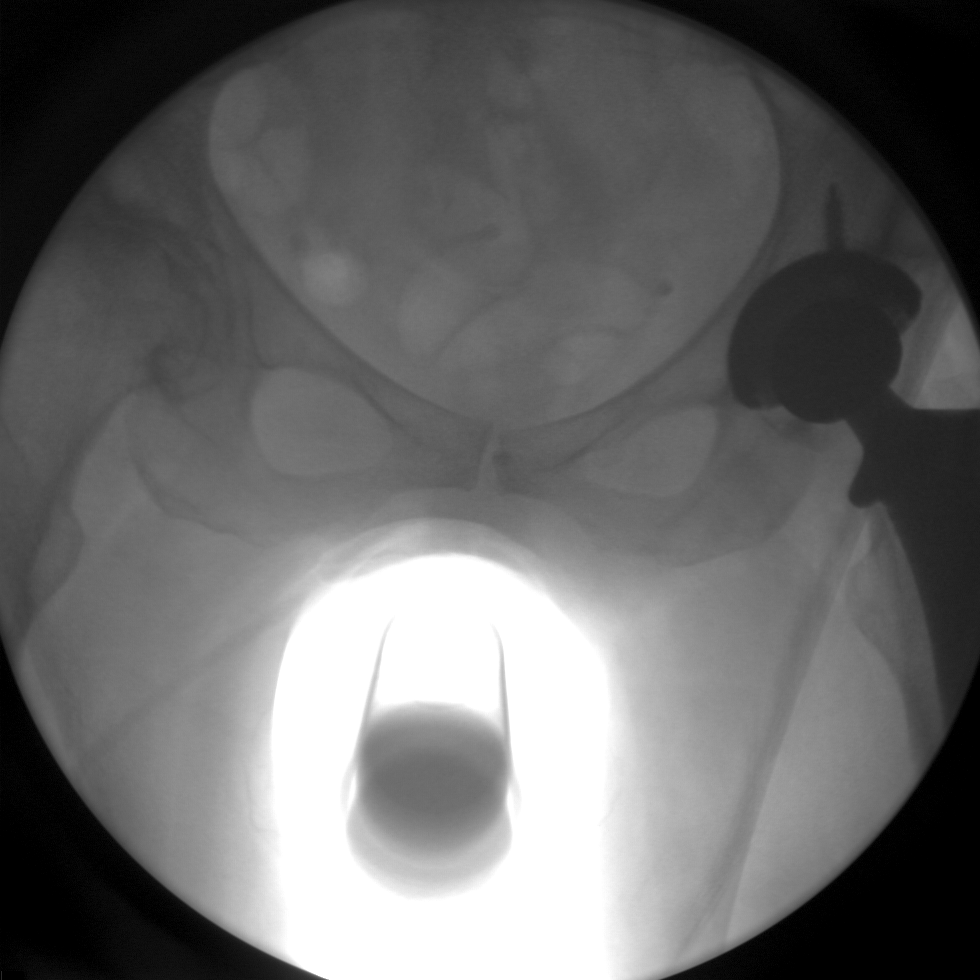

[4 of 4 positions shown; findings below may reference images not displayed]

FINDINGS: Fluoroscopic spot images demonstrate well seated
components of a total left hip arthroplasty.  No complicating
features.
IMPRESSION: Well seated components of a total left hip arthroplasty.

## 2015-07-04 IMAGING — CR DG PORTABLE PELVIS
1 series · 1 of 1 positions shown · non-contrast
Comparison: 05/18/2013

CLINICAL DATA: Postop right hip.

EXAM:
PORTABLE PELVIS 1-2 VIEWS

[AP]
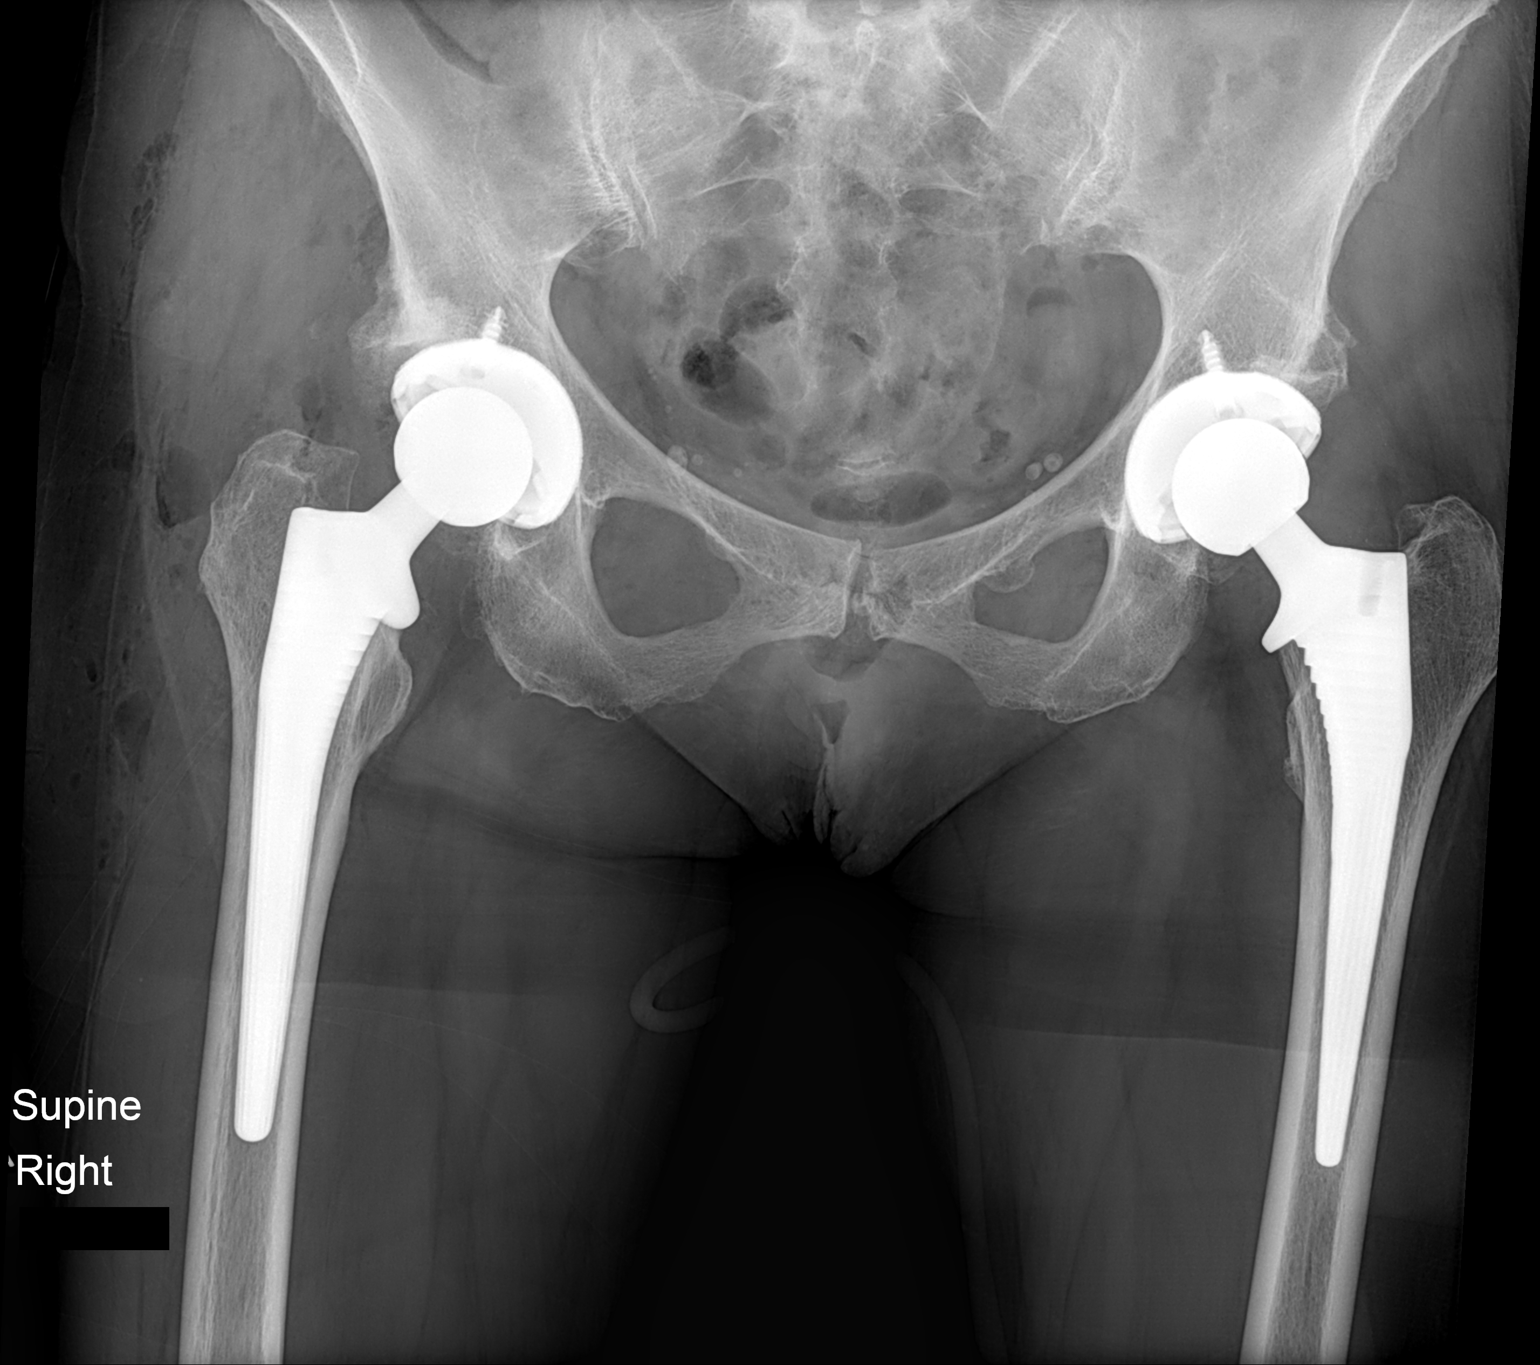

[1 of 1 positions shown; findings below may reference images not displayed]

FINDINGS: Changes of right hip replacement. No hardware or bony complicating
feature. Remote left hip replacement changes.
IMPRESSION: Interval right hip replacement without bony or hardware complicating
feature.

## 2015-07-04 IMAGING — RF DG HIP COMPLETE 2+V*R*
1 series · 2 of 2 positions shown · non-contrast
Comparison: Right hip 12/29/2012

CLINICAL DATA: right hip

EXAM:
RIGHT HIP - COMPLETE 2+ VIEW

[Series 1: run · 2 of 2 slices shown]
[im 1/2]
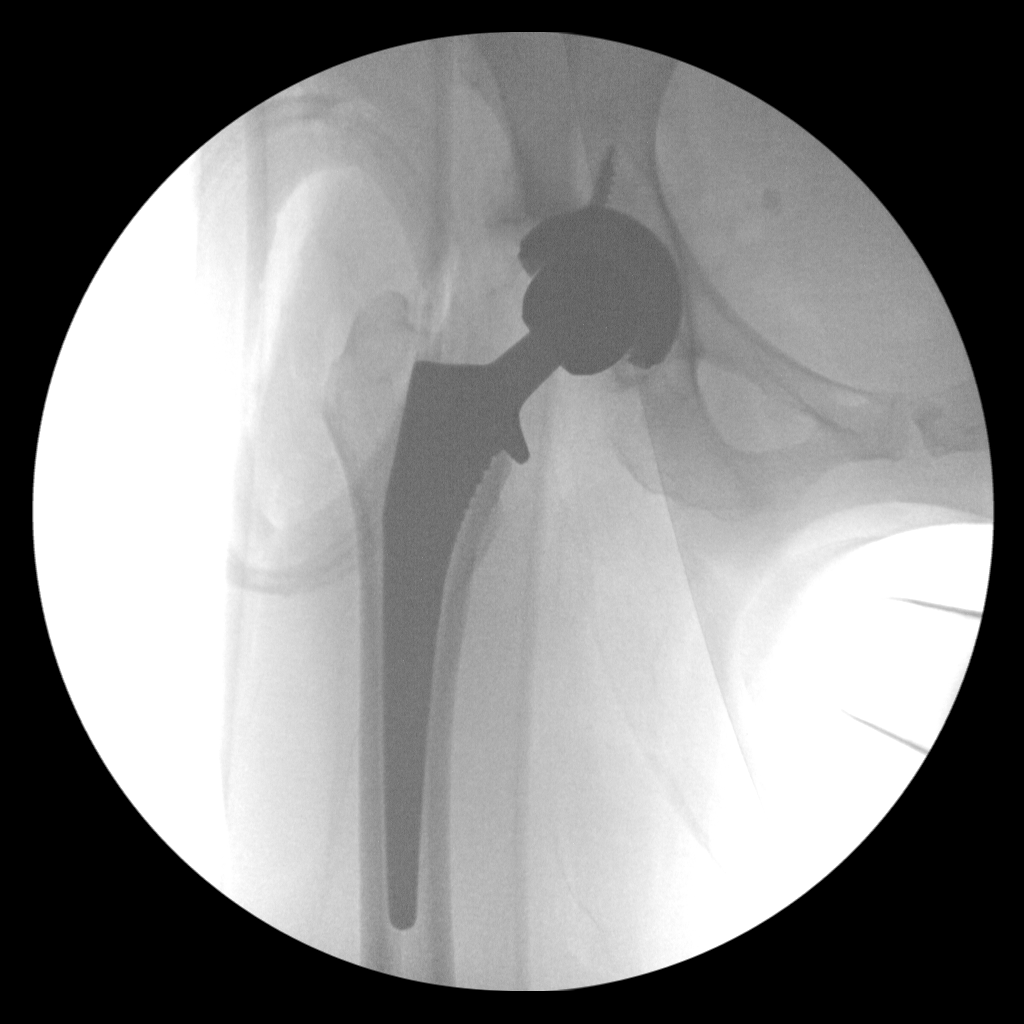
[im 2/2]
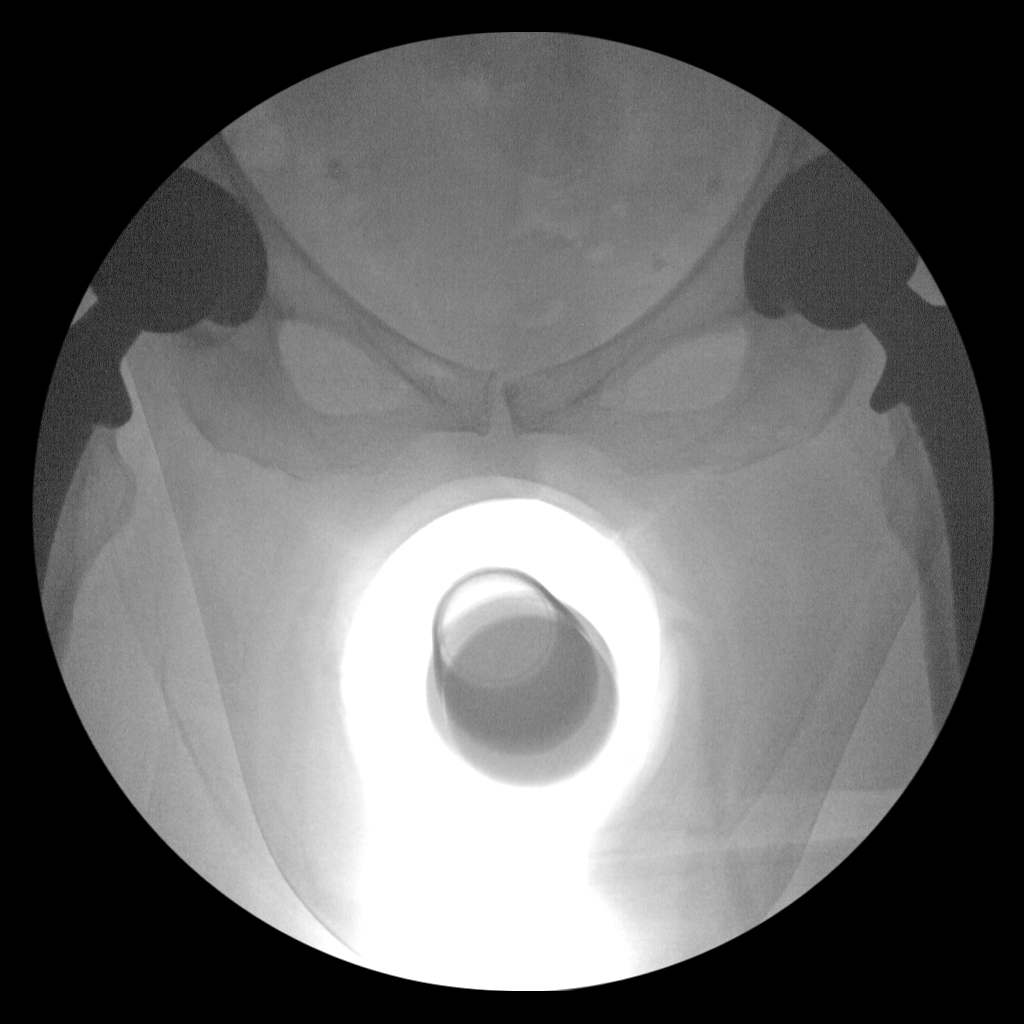

[2 of 2 positions shown; findings below may reference images not displayed]

FINDINGS: Intra op right hip arthroplasty study. Hardware and native osseous
structures grossly intact. Left hip arthroplasty partially
visualized.
IMPRESSION: Intra op right hip arthroplasty evaluation.

## 2017-03-30 ENCOUNTER — Emergency Department (HOSPITAL_BASED_OUTPATIENT_CLINIC_OR_DEPARTMENT_OTHER): Payer: Medicare Other

## 2017-03-30 ENCOUNTER — Emergency Department (HOSPITAL_BASED_OUTPATIENT_CLINIC_OR_DEPARTMENT_OTHER)
Admission: EM | Admit: 2017-03-30 | Discharge: 2017-03-30 | Disposition: A | Discharge status: Home / Self Care | Payer: Medicare Other | Attending: Emergency Medicine | Admitting: Emergency Medicine

## 2017-03-30 ENCOUNTER — Encounter (HOSPITAL_BASED_OUTPATIENT_CLINIC_OR_DEPARTMENT_OTHER): Payer: Self-pay

## 2017-03-30 DIAGNOSIS — S99912A Unspecified injury of left ankle, initial encounter: Secondary | ICD-10-CM | POA: Diagnosis present

## 2017-03-30 DIAGNOSIS — W109XXA Fall (on) (from) unspecified stairs and steps, initial encounter: Secondary | ICD-10-CM | POA: Diagnosis not present

## 2017-03-30 DIAGNOSIS — Y939 Activity, unspecified: Secondary | ICD-10-CM | POA: Insufficient documentation

## 2017-03-30 DIAGNOSIS — Z79899 Other long term (current) drug therapy: Secondary | ICD-10-CM | POA: Diagnosis not present

## 2017-03-30 DIAGNOSIS — S93492A Sprain of other ligament of left ankle, initial encounter: Secondary | ICD-10-CM | POA: Diagnosis not present

## 2017-03-30 DIAGNOSIS — Y929 Unspecified place or not applicable: Secondary | ICD-10-CM | POA: Insufficient documentation

## 2017-03-30 DIAGNOSIS — Y999 Unspecified external cause status: Secondary | ICD-10-CM | POA: Insufficient documentation

## 2017-03-30 MED ORDER — ACETAMINOPHEN 500 MG PO TABS: 500.0000 mg | ORAL_TABLET | Freq: Four times a day (QID) | ORAL | 0 refills | Status: AC | PRN

## 2017-03-30 NOTE — ED Triage Notes (Signed)
Pt states she missed a step/ fell this am-normal ADLs/no pain until approx 5pm when she states she was unable to bear weight left foot-NAD-presents to triage in w/c

## 2017-03-30 NOTE — ED Provider Notes (Signed)
[facility] Note   CSN: [id number] Arrival date & time: 03/30/17  1959  By signing my name below, I, Victoria Ward, attest that this documentation has been prepared under the direction and in the presence of Everlene Farrier, PA-C. Electronically Signed: Doreatha Ward, ED Scribe. 03/30/17. 10:59 PM.    History   Chief Complaint Chief Complaint  Patient presents with  . Ankle Injury    HPI Victoria Ward is a 71 y.o. female who presents to the Emergency Department complaining of moderate, constant left lateral ankle pain s/p mechanical fall that occurred at 7 am. Pt states she mis-steped on the last 2 steps of a staircase and fell, twisting her ankle. She denies LOC or head injury. She reports initially she did not have pain to her ankle, but this developed gradually today. Pt states her pain is worsened with weight bearing and ambulation. She is ambulatory without difficulty. She denies numbness, weakness, tingling, additional injuries.   The history is provided by the patient. No language interpreter was used.    Past Medical History:  Diagnosis Date  . Arthritis    PAIN AND OA BOTH HIPS - LEFT IS WORSE.  PT HAS ARTHRITIS IN HER BACK  . Cystocele 05-08-14  . GERD (gastroesophageal reflux disease)   . H/O hiatal hernia   . Headache(784.0)    MIGRAINES WITH AURA- JUST OCCAS  . Heart palpitations    HX OF PALPITATIONS WITH STRESS  . Hepatitis    AGE 65 - INFECTIOUS HEPATITIS - PROBABLY FROM FOOD--NO KNOWN RESIDUAL LIVER PROBLMS    Patient Active Problem List   Diagnosis Date Noted  . Arthritis of right hip 05/17/2014  . Status post THR (total hip replacement) 05/17/2014  . Postoperative anemia due to acute blood loss 05/20/2013    Class: Acute  . Degenerative arthritis of hip 05/18/2013    Past Surgical History:  Procedure Laterality Date  . APPENDECTOMY     1998  . BACK SURGERY  1993   LAMINECTOMY  . BREAST SURGERY     BREAST BIOPSY X 2 - BENIGN  .  TOTAL HIP ARTHROPLASTY Left 05/18/2013   Procedure: LEFT TOTAL HIP ARTHROPLASTY ANTERIOR APPROACH;  Surgeon: Kathryne Hitch, MD;  Location: [facility];  Service: Orthopedics;  Laterality: Left;  . TOTAL HIP ARTHROPLASTY Right 05/17/2014   Procedure: RIGHT TOTAL HIP ARTHROPLASTY ANTERIOR APPROACH;  Surgeon: Kathryne Hitch, MD;  Location: [facility];  Service: Orthopedics;  Laterality: Right;  . TUBAL LIGATION      OB History    No data available       Home Medications    Prior to Admission medications   Medication Sig Start Date End Date Taking? Authorizing Provider  Polyethylene Glycol 3350 (MIRALAX PO) Take by mouth.   Yes Historical Provider, MD  acetaminophen (TYLENOL) 500 MG tablet Take 1 tablet (500 mg total) by mouth every 6 (six) hours as needed for mild pain or moderate pain. 03/30/17   Everlene Farrier, PA-C  pantoprazole (PROTONIX) 40 MG tablet Take 40 mg by mouth daily before breakfast.    Historical Provider, MD    Family History No family history on file.  Social History Social History  Substance Use Topics  . Smoking status: Never Smoker  . Smokeless tobacco: Never Used  . Alcohol use No     Allergies   Dilaudid [hydromorphone hcl]; Elavil [amitriptyline]; Levaquin [levofloxacin]; and Penicillins   Review of Systems Review of Systems  Constitutional: Negative for fever.  Eyes: Negative for visual disturbance.  Musculoskeletal: Positive for arthralgias and joint swelling. Negative for back pain and neck pain.  Skin: Negative for rash and wound.  Neurological: Negative for weakness and numbness.     Physical Exam Updated Vital Signs BP (!) 147/76 (BP Location: Right Arm)   Pulse 88   Temp 99 F (37.2 C) (Oral)   Resp 16   Ht  (1.651 m)   Wt 63.5 kg   SpO2 97%   BMI 23.30 kg/m   Physical Exam  Constitutional: She appears well-developed and well-nourished. No distress.  Pt is non-toxic in appearance. Appears younger than stated age.    HENT:  Head: Normocephalic and atraumatic.  Eyes: Right eye exhibits no discharge. Left eye exhibits no discharge.  Cardiovascular: Normal rate, regular rhythm and intact distal pulses.   Bilateral DPs and PTs intact.   Pulmonary/Chest: Effort normal. No respiratory distress.  Musculoskeletal: Normal range of motion. She exhibits edema and tenderness. She exhibits no deformity.  Mild tenderness and edema to the lateral aspect of the left ankle. No deformity or left foot TTP. No achilles tenderness. Sensation intact to distal toes. No tenderness to her left knee. No left ankle instability.  Neurological: She is alert. No sensory deficit. Coordination normal.  Skin: Skin is warm and dry. Capillary refill takes less than 2 seconds. No rash noted. She is not diaphoretic. No erythema. No pallor.  Psychiatric: She has a normal mood and affect. Her behavior is normal.  Nursing note and vitals reviewed.    ED Treatments / Results   DIAGNOSTIC STUDIES: Oxygen Saturation is 98% on RA, normal by my interpretation.    COORDINATION OF CARE: 10:56 PM Discussed treatment plan with pt at bedside which includes XR and pt agreed to plan.    Labs (all labs ordered are listed, but only abnormal results are displayed) Labs Reviewed - No data to display  EKG  EKG Interpretation None       Radiology Dg Ankle Complete Left  Result Date: 03/30/2017 CLINICAL DATA:  Initial evaluation for acute trauma, fall. EXAM: LEFT ANKLE COMPLETE - 3+ VIEW COMPARISON:  None. FINDINGS: No acute fracture or dislocation. Ankle mortise approximated. Talar dome intact. No significant soft tissue swelling about the ankle. Osseous mineralization normal. IMPRESSION: No acute osseous abnormality about the left ankle. Electronically Signed   By: Rise Mu M.D.   On: 03/30/2017 21:35    Procedures Procedures (including critical care time)  Medications Ordered in ED Medications - No data to  display   Initial Impression / Assessment and Plan / ED Course  I have reviewed the triage vital signs and the nursing notes.  Pertinent labs & imaging results that were available during my care of the patient were reviewed by me and considered in my medical decision making (see chart for details).    This is a 71 y.o. female who presents to the Emergency Department complaining of moderate, constant left lateral ankle pain s/p mechanical fall that occurred at 7 am. Pt states she mis-steped on the last 2 steps of a staircase and fell, twisting her ankle. She denies LOC or head injury. She reports initially she did not have pain to her ankle, but this developed gradually today. Pt states her pain is worsened with weight bearing and ambulation. On exam the patient is afebrile nontoxic appearing. She is mild edema and tenderness noted to the lateral aspect of her left ankle. No ankle and stability noted. She is  neurovascularly intact. Patient X-Ray negative for obvious fracture or dislocation. Will place in an ASO ankle brace and encouraged to remain nonweightbearing. Patient on crutches and tells me she has a walker at home after her hip replacements. Pt advised to follow up with orthopedics if her pain persists. Patient given lace up ankle brace while in ED, conservative therapy recommended and discussed. Patient will be discharged home & is agreeable with above plan. Returns precautions discussed. Pt appears safe for discharge.  I advised the patient to follow-up with their primary care provider this week. I advised the patient to return to the emergency department with new or worsening symptoms or new concerns. The patient verbalized understanding and agreement with plan.    Final Clinical Impressions(s) / ED Diagnoses   Final diagnoses:  Sprain of other ligament of left ankle, initial encounter    New Prescriptions Discharge Medication List as of 03/30/2017 11:01 PM    START taking these  medications   Details  acetaminophen (TYLENOL) 500 MG tablet Take 1 tablet (500 mg total) by mouth every 6 (six) hours as needed for mild pain or moderate pain., Starting Wed 03/30/2017, Print        I personally performed the services described in this documentation, which was scribed in my presence. The recorded information has been reviewed and is accurate.      Everlene Farrier, PA-C 03/30/17 2330    Benjiman Core, MD 03/31/17 [phone]

## 2017-07-26 ENCOUNTER — Telehealth (INDEPENDENT_AMBULATORY_CARE_PROVIDER_SITE_OTHER): Payer: Self-pay | Admitting: Orthopaedic Surgery

## 2017-07-26 NOTE — Telephone Encounter (Signed)
Patient called asked if Dr. Magnus IvanBlackman can do knee surgery. Patient want to talk to Dr Magnus IvanBlackman concerning knee. Patient wanted Dr. Magnus IvanBlackman to know she has the [employer] [employer] connection and he will know what it mean. The number to contact patient is [phone]

## 2019-10-31 ENCOUNTER — Emergency Department (HOSPITAL_BASED_OUTPATIENT_CLINIC_OR_DEPARTMENT_OTHER)
Admission: EM | Admit: 2019-10-31 | Discharge: 2019-10-31 | Disposition: A | Discharge status: Home / Self Care | Payer: Medicare Other | Attending: Emergency Medicine | Admitting: Emergency Medicine

## 2019-10-31 ENCOUNTER — Emergency Department (HOSPITAL_BASED_OUTPATIENT_CLINIC_OR_DEPARTMENT_OTHER): Payer: Medicare Other

## 2019-10-31 ENCOUNTER — Encounter (HOSPITAL_BASED_OUTPATIENT_CLINIC_OR_DEPARTMENT_OTHER): Payer: Self-pay

## 2019-10-31 ENCOUNTER — Other Ambulatory Visit: Payer: Self-pay

## 2019-10-31 DIAGNOSIS — I10 Essential (primary) hypertension: Secondary | ICD-10-CM | POA: Insufficient documentation

## 2019-10-31 DIAGNOSIS — Z96643 Presence of artificial hip joint, bilateral: Secondary | ICD-10-CM | POA: Diagnosis not present

## 2019-10-31 DIAGNOSIS — Z79899 Other long term (current) drug therapy: Secondary | ICD-10-CM | POA: Insufficient documentation

## 2019-10-31 DIAGNOSIS — R29898 Other symptoms and signs involving the musculoskeletal system: Secondary | ICD-10-CM

## 2019-10-31 DIAGNOSIS — R531 Weakness: Secondary | ICD-10-CM | POA: Insufficient documentation

## 2019-10-31 LAB — COMPREHENSIVE METABOLIC PANEL
ALT: 14 U/L (ref 0–44)
AST: 21 U/L (ref 15–41)
Albumin: 4.3 g/dL (ref 3.5–5.0)
Alkaline Phosphatase: 82 U/L (ref 38–126)
Anion gap: 5 (ref 5–15)
BUN: 18 mg/dL (ref 8–23)
CO2: 28 mmol/L (ref 22–32)
Calcium: 9.3 mg/dL (ref 8.9–10.3)
Chloride: 104 mmol/L (ref 98–111)
Creatinine, Ser: 0.89 mg/dL (ref 0.44–1.00)
GFR calc Af Amer: >60 mL/min (ref >60)
GFR calc non Af Amer: >60 mL/min (ref >60)
Glucose, Bld: 107 mg/dL — ABNORMAL HIGH (ref 70–99)
Potassium: 3.9 mmol/L (ref 3.5–5.1)
Sodium: 137 mmol/L (ref 135–145)
Total Bilirubin: 0.7 mg/dL (ref 0.3–1.2)
Total Protein: 7 g/dL (ref 6.5–8.1)

## 2019-10-31 LAB — URINALYSIS, ROUTINE W REFLEX MICROSCOPIC
Bilirubin Urine: NEGATIVE
Glucose, UA: NEGATIVE mg/dL
Hgb urine dipstick: NEGATIVE
Ketones, ur: NEGATIVE mg/dL
Leukocytes,Ua: NEGATIVE
Nitrite: NEGATIVE
Protein, ur: NEGATIVE mg/dL
Specific Gravity, Urine: 1.015 (ref 1.005–1.030)
pH: 6.5 (ref 5.0–8.0)

## 2019-10-31 LAB — DIFFERENTIAL
Abs Immature Granulocytes: 0.01 10*3/uL (ref 0.00–0.07)
Basophils Absolute: 0 10*3/uL (ref 0.0–0.1)
Basophils Relative: 0 %
Eosinophils Absolute: 0 10*3/uL (ref 0.0–0.5)
Eosinophils Relative: 0 %
Immature Granulocytes: 0 %
Lymphocytes Relative: 22 %
Lymphs Abs: 1.1 10*3/uL (ref 0.7–4.0)
Monocytes Absolute: 0.2 10*3/uL (ref 0.1–1.0)
Monocytes Relative: 5 %
Neutro Abs: 3.4 10*3/uL (ref 1.7–7.7)
Neutrophils Relative %: 73 %

## 2019-10-31 LAB — CBC
HCT: 46.2 % — ABNORMAL HIGH (ref 36.0–46.0)
Hemoglobin: 14.9 g/dL (ref 12.0–15.0)
MCH: 31.4 pg (ref 26.0–34.0)
MCHC: 32.3 g/dL (ref 30.0–36.0)
MCV: 97.3 fL (ref 80.0–100.0)
Platelets: 213 10*3/uL (ref 150–400)
RBC: 4.75 MIL/uL (ref 3.87–5.11)
RDW: 12.7 % (ref 11.5–15.5)
WBC: 4.8 10*3/uL (ref 4.0–10.5)
nRBC: 0 % (ref 0.0–0.2)

## 2019-10-31 LAB — ETHANOL: Alcohol, Ethyl (B): <10 mg/dL (ref <10)

## 2019-10-31 LAB — PROTIME-INR
INR: 0.9 (ref 0.8–1.2)
Prothrombin Time: 12.1 seconds (ref 11.4–15.2)

## 2019-10-31 LAB — RAPID URINE DRUG SCREEN, HOSP PERFORMED
Amphetamines: NOT DETECTED
Barbiturates: NOT DETECTED
Benzodiazepines: NOT DETECTED
Cocaine: NOT DETECTED
Opiates: NOT DETECTED
Tetrahydrocannabinol: NOT DETECTED

## 2019-10-31 LAB — APTT: aPTT: 35 seconds (ref 24–36)

## 2019-10-31 NOTE — ED Triage Notes (Signed)
Pt reports weakness and heaviness in her right arm starting Sunday. Pt reports that this comes and goes. Pt reports noticing it more when helping her grandson with his schooling.

## 2019-10-31 NOTE — Discharge Instructions (Signed)
Work-up here today with a negative head CT.  But your difficulty with writing with your right hand could represent a small stroke.  Symptoms have been present for about a week so urgent MRI not required.  But would recommend outpatient MRI through your primary care doctor.  In addition your blood pressure running on the high side but without any intervention blood pressure came down on its own to 160/87.  Recommend keeping a daily log of your blood pressure medicine and following back up with your primary care doctor.  Return for any new or worse symptoms.

## 2019-10-31 NOTE — ED Provider Notes (Signed)
[facility] EMERGENCY DEPARTMENT Provider Note   CSN: [id number] Arrival date & time: 10/31/19  1442     History   Chief Complaint Chief Complaint  Patient presents with   Arm Pain    HPI Victoria Ward is a 73 y.o. female.     Patient sent in by primary care office for concerns for high blood pressure.  And patient is describing a weakness and heaviness in the right arm intermittently starting a week ago.  Patient's had a mild headache today but has not had any significant headaches over the past few days.  Upon arrival blood pressure was 177/88.  Patient is already on Toprol XL which she is taking.  She has a longstanding history of hypertension.  States for the past week her blood pressures have been higher than usual.  Patient states that the only time she notices a problem with the right arm which is her dominant arm is when she tries to write.  She describes as intermittent but only notices when she tries to write never has been able to write without noticing it.  Some questionable may be mild weakness to the right lower extremity.  Denies any visual or speech problems.  No nausea or vomiting no fevers.     Past Medical History:  Diagnosis Date   Arthritis    PAIN AND OA BOTH HIPS - LEFT IS WORSE.  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/17/2014   Postoperative anemia due to acute blood loss 05/20/2013    Class: Acute   Degenerative arthritis of hip 05/18/2013    Past Surgical History:  Procedure Laterality Date   APPENDECTOMY     1998   BACK SURGERY  1993   LAMINECTOMY   BREAST SURGERY     BREAST BIOPSY X 2 - BENIGN   TOTAL HIP ARTHROPLASTY Left 05/18/2013   Procedure: LEFT TOTAL HIP ARTHROPLASTY ANTERIOR APPROACH;  Surgeon: Kathryne Hitch, MD;  Location: [facility];  Service: Orthopedics;  Laterality: Left;   TOTAL HIP ARTHROPLASTY Right 05/17/2014   Procedure: RIGHT TOTAL HIP ARTHROPLASTY ANTERIOR APPROACH;  Surgeon: Kathryne Hitch, MD;  Location: [facility];  Service: Orthopedics;  Laterality: Right;   TUBAL LIGATION       OB History   No obstetric history on file.      Home Medications    Prior to Admission medications   Medication Sig Start Date End Date Taking? Authorizing Provider  metoprolol succinate (TOPROL-XL) 25 MG 24 hr tablet TAKE 1 TABLET(25 MG) BY MOUTH DAILY 06/14/19  Yes [provider]  pantoprazole (PROTONIX) 40 MG tablet TAKE 1 TABLET BY MOUTH EVERY DAY 05/26/15  Yes [provider]  acetaminophen (TYLENOL) 500 MG tablet Take 1 tablet (500 mg total) by mouth every 6 (six) hours as needed for mild pain or moderate pain. 03/30/17   Everlene Farrier, PA-C  pantoprazole (PROTONIX) 40 MG tablet Take 40 mg by mouth daily before breakfast.    [provider]  Polyethylene Glycol 3350 (MIRALAX PO) Take by mouth.    [provider]  valACYclovir (VALTREX) 1000 MG tablet Take 1,000 mg by mouth daily. 10/27/19   [provider]    Family History History reviewed. No pertinent family history.  Social History Social History   Tobacco Use   Smoking status: Never Smoker   Smokeless tobacco: Never Used  Substance Use Topics   Alcohol use: No   Drug use: No     Allergies   Dilaudid [hydromorphone hcl], Elavil [amitriptyline], Levaquin [levofloxacin], and Penicillins   Review of Systems Review of Systems  Constitutional: Negative for chills and fever.  HENT: Negative for congestion, rhinorrhea and sore throat.   Eyes: Negative for  visual disturbance.  Respiratory: Negative for cough and shortness of breath.   Cardiovascular: Negative for chest pain and leg swelling.  Gastrointestinal: Negative for abdominal pain, diarrhea, nausea and vomiting.  Genitourinary: Negative for dysuria.  Musculoskeletal: Negative for back pain and neck pain.  Skin: Negative for rash.  Neurological: Positive for weakness and headaches. Negative for dizziness, tremors, facial asymmetry, speech difficulty, light-headedness and numbness.  Hematological: Does not bruise/bleed easily.  Psychiatric/Behavioral: Negative for confusion.     Physical Exam Updated Vital Signs BP (!) 160/87    Pulse 91    Temp 98.7 F (37.1 C) (Oral)    Resp 15    Ht 1.651 m (5\' 5" )    Wt 61.7 kg    SpO2 99%    BMI 22.63 kg/m   Physical Exam Vitals signs and nursing note reviewed.  Constitutional:      General: She is not in acute distress.    Appearance: Normal appearance. She is well-developed.  HENT:     Head: Normocephalic and atraumatic.  Eyes:     Extraocular Movements: Extraocular movements intact.     Conjunctiva/sclera: Conjunctivae normal.     Pupils: Pupils are equal, round, and reactive to light.  Neck:     Musculoskeletal: Normal range of motion and neck supple.  Cardiovascular:     Rate and Rhythm: Normal rate and regular rhythm.     Heart sounds: No murmur.  Pulmonary:     Effort: Pulmonary effort is normal. No respiratory distress.     Breath sounds: Normal breath sounds.  Abdominal:     Palpations: Abdomen is soft.     Tenderness: There is no abdominal tenderness.  Musculoskeletal: Normal range of motion.        General: No swelling.  Skin:    General: Skin is warm and dry.     Capillary Refill: Capillary refill takes less than 2 seconds.  Neurological:     Mental Status: She is alert.     Cranial Nerves: No cranial nerve deficit.     Sensory: No sensory deficit.     Motor: No weakness.     Coordination: Coordination abnormal.       Gait: Gait normal.     Comments: On exam no appreciable weakness to the right hand compared to left.  Finger pinch in all strength in the hand seems to be very strong.  Finger-to-nose a little bit off compared to the left which is a little concerning since patient is right-hand dominant.  No motor weakness in the lower extremity noticed at all.  And he will shin was totally intact with both legs.      ED Treatments / Results  Labs (all labs ordered are listed, but only abnormal results are displayed) Labs  Reviewed  CBC - Abnormal; Notable for the following components:      Result Value   HCT 46.2 (*)    All other components within normal limits  COMPREHENSIVE METABOLIC PANEL - Abnormal; Notable for the following components:   Glucose, Bld 107 (*)    All other components within normal limits  ETHANOL  PROTIME-INR  APTT  DIFFERENTIAL  RAPID URINE DRUG SCREEN, HOSP PERFORMED  URINALYSIS, ROUTINE W REFLEX MICROSCOPIC    EKG EKG Interpretation  Date/Time:  Wednesday October 31 2019 16:19:30 EST Ventricular Rate:  87 PR Interval:    QRS Duration: 91 QT Interval:  350 QTC Calculation: 421 R Axis:   74 Text Interpretation: Sinus rhythm Probable left atrial enlargement Confirmed by Fredia Sorrow [id number] on 10/31/2019 4:34:40 PM   Radiology Ct Head Wo Contrast  Result Date: 10/31/2019 CLINICAL DATA:  Pt reports weakness and heaviness in her right arm starting Sunday. Pt reports that this comes and goes.Focal neuro deficit, > 6 hrs, stroke suspected EXAM: CT HEAD WITHOUT CONTRAST TECHNIQUE: Contiguous axial images were obtained from the base of the skull through the vertex without intravenous contrast. COMPARISON:  None. FINDINGS: Brain: No acute intracranial hemorrhage. No focal mass lesion. No CT evidence of acute infarction. No midline shift or mass effect. No hydrocephalus. Basilar cisterns are patent. There are periventricular and subcortical white matter hypodensities.  Generalized cortical atrophy. Vascular: No hyperdense vessel or unexpected calcification. Skull: Normal. Negative for fracture or focal lesion. Sinuses/Orbits: Paranasal sinuses and mastoid air cells are clear. Orbits are clear. Other: None. IMPRESSION: 1. No acute intracranial findings. 2. Atrophy and white matter microvascular disease.  Is Electronically Signed   By: Suzy Bouchard M.D.   On: 10/31/2019 16:20    Procedures Procedures (including critical care time)  Medications Ordered in ED Medications - No data to display   Initial Impression / Assessment and Plan / ED Course  I have reviewed the triage vital signs and the nursing notes.  Pertinent labs & imaging results that were available during my care of the patient were reviewed by me and considered in my medical decision making (see chart for details).       Concerned for the difficulty with writing for the past week which is really only present when she tries to write.  Every time she tries to write if there.  Never had a time when she tried the right and it was not there does raise some concerns for subtle stroke.  However symptoms have been present for a week.  CT head without any acute findings.  Blood pressure improved here without any intervention to 160/87.  For the concern for stroke would recommend outpatient MRI.  With symptoms been present for a week do not see any need for emergent transfer to have MRI tonight.  For the blood pressure recommending blood pressure log may require some improvements in her blood pressure medication we will leave that up to the primary care doctor.  Overall patient nontoxic no acute distress.  Stable for discharge home.  Final Clinical Impressions(s) / ED Diagnoses   Final diagnoses:  Right arm weakness  Essential hypertension    ED Discharge Orders    None       Fredia Sorrow, MD 10/31/19 [phone]
